# Patient Record
Sex: Male | Born: 1950
Health system: Southern US, Community
[De-identification: ages and names within clinical notes are randomized; demographics above are authoritative.]

## PROBLEM LIST (undated history)

## (undated) DIAGNOSIS — K279 Peptic ulcer, site unspecified, unspecified as acute or chronic, without hemorrhage or perforation: Secondary | ICD-10-CM

## (undated) DIAGNOSIS — C61 Malignant neoplasm of prostate: Secondary | ICD-10-CM

## (undated) DIAGNOSIS — M1990 Unspecified osteoarthritis, unspecified site: Secondary | ICD-10-CM

## (undated) DIAGNOSIS — K219 Gastro-esophageal reflux disease without esophagitis: Secondary | ICD-10-CM

## (undated) DIAGNOSIS — K5792 Diverticulitis of intestine, part unspecified, without perforation or abscess without bleeding: Secondary | ICD-10-CM

## (undated) DIAGNOSIS — G2581 Restless legs syndrome: Secondary | ICD-10-CM

## (undated) DIAGNOSIS — E785 Hyperlipidemia, unspecified: Secondary | ICD-10-CM

## (undated) HISTORY — DX: Peptic ulcer, site unspecified, unspecified as acute or chronic, without hemorrhage or perforation: K27.9

## (undated) HISTORY — PX: HERNIA REPAIR: SHX51

## (undated) HISTORY — DX: Hyperlipidemia, unspecified: E78.5

## (undated) HISTORY — DX: Malignant neoplasm of prostate: C61

---

## 1987-12-30 HISTORY — PX: NASAL POLYP EXCISION: SHX2068

## 2000-01-29 ENCOUNTER — Encounter: Payer: Self-pay | Admitting: Specialist

## 2000-01-29 ENCOUNTER — Ambulatory Visit (HOSPITAL_COMMUNITY): Admission: RE | Admit: 2000-01-29 | Discharge: 2000-01-29 | Payer: Self-pay | Admitting: Specialist

## 2000-02-12 ENCOUNTER — Encounter: Payer: Self-pay | Admitting: Specialist

## 2000-02-12 ENCOUNTER — Ambulatory Visit (HOSPITAL_COMMUNITY): Admission: RE | Admit: 2000-02-12 | Discharge: 2000-02-12 | Payer: Self-pay | Admitting: Specialist

## 2000-02-26 ENCOUNTER — Encounter: Payer: Self-pay | Admitting: Specialist

## 2000-02-26 ENCOUNTER — Ambulatory Visit (HOSPITAL_COMMUNITY): Admission: RE | Admit: 2000-02-26 | Discharge: 2000-02-26 | Payer: Self-pay | Admitting: Specialist

## 2002-01-26 ENCOUNTER — Ambulatory Visit (HOSPITAL_COMMUNITY): Admission: RE | Admit: 2002-01-26 | Discharge: 2002-01-26 | Payer: Self-pay | Admitting: Family Medicine

## 2002-01-26 ENCOUNTER — Encounter: Payer: Self-pay | Admitting: Family Medicine

## 2002-01-31 ENCOUNTER — Encounter: Payer: Self-pay | Admitting: Family Medicine

## 2002-01-31 ENCOUNTER — Ambulatory Visit (HOSPITAL_COMMUNITY): Admission: RE | Admit: 2002-01-31 | Discharge: 2002-01-31 | Payer: Self-pay | Admitting: Family Medicine

## 2003-02-24 ENCOUNTER — Encounter: Payer: Self-pay | Admitting: Family Medicine

## 2003-02-24 ENCOUNTER — Ambulatory Visit (HOSPITAL_COMMUNITY): Admission: RE | Admit: 2003-02-24 | Discharge: 2003-02-24 | Payer: Self-pay | Admitting: Family Medicine

## 2003-10-30 HISTORY — PX: KNEE ARTHROSCOPY: SHX127

## 2003-11-27 ENCOUNTER — Ambulatory Visit (HOSPITAL_BASED_OUTPATIENT_CLINIC_OR_DEPARTMENT_OTHER): Admission: RE | Admit: 2003-11-27 | Discharge: 2003-11-27 | Payer: Self-pay | Admitting: Specialist

## 2004-03-04 ENCOUNTER — Emergency Department (HOSPITAL_COMMUNITY): Admission: EM | Admit: 2004-03-04 | Discharge: 2004-03-05 | Payer: Self-pay | Admitting: *Deleted

## 2004-04-03 ENCOUNTER — Ambulatory Visit (HOSPITAL_COMMUNITY): Admission: RE | Admit: 2004-04-03 | Discharge: 2004-04-03 | Payer: Self-pay | Admitting: Family Medicine

## 2004-10-11 ENCOUNTER — Ambulatory Visit (HOSPITAL_COMMUNITY): Admission: RE | Admit: 2004-10-11 | Discharge: 2004-10-11 | Payer: Self-pay | Admitting: Gastroenterology

## 2005-05-23 ENCOUNTER — Ambulatory Visit (HOSPITAL_COMMUNITY): Admission: RE | Admit: 2005-05-23 | Discharge: 2005-05-23 | Payer: Self-pay | Admitting: Family Medicine

## 2006-04-01 ENCOUNTER — Ambulatory Visit (HOSPITAL_COMMUNITY): Admission: RE | Admit: 2006-04-01 | Discharge: 2006-04-01 | Payer: Self-pay | Admitting: Family Medicine

## 2006-08-21 ENCOUNTER — Encounter: Admission: RE | Admit: 2006-08-21 | Discharge: 2006-08-21 | Payer: Self-pay | Admitting: Gastroenterology

## 2007-08-31 ENCOUNTER — Ambulatory Visit (HOSPITAL_COMMUNITY): Admission: RE | Admit: 2007-08-31 | Discharge: 2007-08-31 | Payer: Self-pay | Admitting: Family Medicine

## 2008-07-12 ENCOUNTER — Ambulatory Visit (HOSPITAL_COMMUNITY): Admission: RE | Admit: 2008-07-12 | Discharge: 2008-07-12 | Payer: Self-pay | Admitting: Family Medicine

## 2010-07-20 ENCOUNTER — Emergency Department (HOSPITAL_COMMUNITY): Admission: EM | Admit: 2010-07-20 | Discharge: 2010-07-20 | Payer: Self-pay | Admitting: Emergency Medicine

## 2010-10-07 ENCOUNTER — Ambulatory Visit (HOSPITAL_COMMUNITY): Admission: RE | Admit: 2010-10-07 | Discharge: 2010-10-07 | Payer: Self-pay | Admitting: Family Medicine

## 2010-10-09 ENCOUNTER — Emergency Department (HOSPITAL_COMMUNITY): Admission: EM | Admit: 2010-10-09 | Discharge: 2010-10-09 | Payer: Self-pay | Admitting: Emergency Medicine

## 2010-10-23 ENCOUNTER — Encounter: Admission: RE | Admit: 2010-10-23 | Discharge: 2010-10-23 | Payer: Self-pay | Admitting: Gastroenterology

## 2011-01-18 ENCOUNTER — Encounter: Payer: Self-pay | Admitting: Family Medicine

## 2011-01-19 ENCOUNTER — Encounter: Payer: Self-pay | Admitting: Gastroenterology

## 2011-03-12 LAB — POCT CARDIAC MARKERS
CKMB, poc: <1 ng/mL — ABNORMAL LOW (ref 1.0–8.0)
Myoglobin, poc: 37.1 ng/mL (ref 12–200)
Myoglobin, poc: 42.3 ng/mL (ref 12–200)

## 2011-03-12 LAB — DIFFERENTIAL
Basophils Relative: 0 % (ref 0–1)
Eosinophils Absolute: 0.1 10*3/uL (ref 0.0–0.7)
Lymphs Abs: 1.5 10*3/uL (ref 0.7–4.0)
Monocytes Absolute: 0.4 10*3/uL (ref 0.1–1.0)
Monocytes Relative: 8 % (ref 3–12)
Neutrophils Relative %: 62 % (ref 43–77)

## 2011-03-12 LAB — BASIC METABOLIC PANEL
CO2: 30 mEq/L (ref 19–32)
Chloride: 106 mEq/L (ref 96–112)
GFR calc Af Amer: >60 mL/min (ref >60)
Glucose, Bld: 114 mg/dL — ABNORMAL HIGH (ref 70–99)
Potassium: 4.2 mEq/L (ref 3.5–5.1)
Sodium: 141 mEq/L (ref 135–145)

## 2011-03-12 LAB — CBC
HCT: 43.2 % (ref 39.0–52.0)
Hemoglobin: 15 g/dL (ref 13.0–17.0)
MCH: 32.6 pg (ref 26.0–34.0)
MCHC: 34.7 g/dL (ref 30.0–36.0)
MCV: 93.8 fL (ref 78.0–100.0)
RBC: 4.61 MIL/uL (ref 4.22–5.81)

## 2011-05-16 NOTE — Op Note (Signed)
Dominic Oliver, Dominic Oliver                  ACCOUNT NO.:  [id number]   MEDICAL RECORD NO.:  [id number]          PATIENT TYPE:  AMB   LOCATION:  ENDO                         FACILITY:  [facility]   PHYSICIAN:  Petra Kuba, M.D.    DATE OF BIRTH:  06/30/1951   DATE OF PROCEDURE:  10/11/2004  DATE OF DISCHARGE:                                 OPERATIVE REPORT   PROCEDURE:  Colonoscopy.   INDICATIONS FOR PROCEDURE:  Family history of colon polyps due for repeat  screening. Consent was signed after risks, benefits, methods, and options  were thoroughly discussed in the office actually by Dr. Madilyn Fireman and with me  prior to any sedation.   MEDICINES USED:  Demerol 60, Versed 6.   DESCRIPTION OF PROCEDURE:  Rectal inspection was pertinent for small  external hemorrhoids. Digital exam was negative. The pediatric video  adjustable colonoscope was inserted and with minimal difficulty due to a  long looping tortuous colon was able to be advanced to the cecum.  This did  require abdominal pressure and rolling him on his back.  The prep was poor  throughout. Lots of washing and suctioning was done just to see where we  were going and to keep from clogging the scope.  The cecum was identified by  the appendiceal orifice and the ileocecal valve. When the scope was  withdrawn some washing and suctioning was done but we frequently clogged the  scope and had to rewash through the suction just to unclog it although no  obvious polypoid lesions or other obvious abnormalities were seen on slow  withdrawal.  With the poor prep, obvious lesions could have been missed.  The prep seemed to be poorest around the splenic flexure which was also  fairly tortuous and also in the rectum where some formed stool could not be  washed and suctioned and lesions underneath it could have been missed. Once  back in the rectum, however, anal rectal pullthrough and retroflexion  confirmed some small hemorrhoids.  The scope was reinserted  a short ways up  the left side of the colon,  air was suctioned, scope removed. The patient  tolerated the procedure well. There was no obvious or immediate  complications.   ENDOSCOPIC DIAGNOSIS:  1.  Internal and external hemorrhoids, small.  2.  Poor prep but without any obvious lesions being seen on exam to the      cecum.   PLAN:  Recheck in one year with increased prep probably Mag Citrate and  GoLYTELY. Yearly rectal's and guaiacs per Kirk Ruths, M.D. and happy  to see back p.r.n.      MEM/MEDQ  D:  10/11/2004  T:  10/11/2004  Job:  [id number]   cc:   Kirk Ruths, M.D.  [address]  [place]  [place] [address]  Fax: [phone]

## 2011-05-16 NOTE — Op Note (Signed)
NAMEJACARI, Dominic Oliver                            ACCOUNT NO.:  [id number]   MEDICAL RECORD NO.:  [id number]                   PATIENT TYPE:  AMB   LOCATION:  DSC                                  FACILITY:  [facility]   PHYSICIAN:  Jene Every, M.D.                 DATE OF BIRTH:  12-21-1951   DATE OF PROCEDURE:  11/27/2003  DATE OF DISCHARGE:                                 OPERATIVE REPORT   PREOPERATIVE DIAGNOSIS:  Medial meniscus tear.   POSTOPERATIVE DIAGNOSIS:  1. Medial meniscus tear.  2. Chondromalacia, medial femoral condyle and patella, loose body.   OPERATION/PROCEDURE:  1. Right knee arthroscopy.  2. Partial medial meniscectomy.  3. Chondromalacia medial femoral condyle and patella.   BRIEF HISTORY AND INDICATION:  This is a 60 year old, refractory knee, pain  and giving away.  MRI indicated meniscus.  Intervention was indicated for  partial medial meniscectomy.  Risks and benefits were discussed including  bleeding, infection, __________  range of motion, need for further  resection, total knee in the future, etc.   DESCRIPTION OF PROCEDURE:  With the patient in the supine position, after  induction of adequate general anesthesia, 1 g of Kefzol, the right lower  extremity was prepped and draped in the usual sterile fashion.  Lateral  parapatellar portal and superior medial parapatellar portal were fashioned  with the #11 blade.  Ingress cannula was atraumatically placed.  Irrigant  was utilized to insufflate the joint.  Camera inserted via the lateral port  and under direct visualization medial parapatellar portal was fashioned with  a #11 blade after  localization with an 18-gauge needle sparing the medial  meniscus.  Medially seen was a displaced medial meniscus tear, radial,  flipped into the joint with significant complex tearing of the middle third  of the meniscus.  There was a loose cartilaginous body in the medial  compartment as well as grade 3 extensive changes  in the medial femoral  condyle with a chondral flap tear.  A 4.2 Kuda shaver was introduced  utilized to perform chondroplasty of the patella, evacuate the loose body  and debride the meniscus.  A basket rongeur was then introduced and utilized  to perform partial medial meniscectomy.  The portals were switched for this.  This was done to a stable base.  The shaver was then reintroduced and  utilized to contour the remaining meniscus to a stable base.  There was  tearing out to the vascular zone.  I introduced a bipolar to cauterize this  region.  There was some attenuation in the meniscal capsular junction but  then stable to probe palpation after the medial meniscal tear.  Chondroplasty of the tibial plateau was performed as well.  Extensive probe  of the medial meniscus revealed there was a stable remnant.   We then examined the suprapatellar pouch.  There was normal patellofemoral  tracking.  There was a grade 3 chondromalacia of the inferior pole and  chondroplasty was performed here as well.  The gutters were unremarkable.  The lateral meniscus revealed normal tibial plateau, lateral femoral  condyle, meniscus stable to probe palpation without evidence of tear.  There  was some degenerative fraying of the ACL and the PCL, and some partial  tearing.  He had a negative Lachman on the examination under anesthesia  prior to the procedure.  Copiously lavaged and reexamined all compartments  and the gutters and the loose cartilaginous debris.  Reexamined the medial  compartment.  Again extensive grade 3 changes, near grade 4 changes in the  medial femoral condyle.  The remaining meniscus was stable to probe  palpation.  Next, all instrumentation was removed.  Copiously lavaged.  Portals were closed with 4-0 nylon simple sutures.  Marcaine 0.25% with  epinephrine was infiltrated in the joint.  Wounds dressed sterilely.  The  patient was awakened without difficulty and transported to the  recovery room  in satisfactory condition.  The patient tolerated the procedure well.  There  were no complications.                                               Jene Every, M.D.    Cordelia Pen  D:  11/27/2003  T:  11/27/2003  Job:  [id number]

## 2012-03-19 ENCOUNTER — Other Ambulatory Visit (HOSPITAL_COMMUNITY): Payer: Self-pay | Admitting: Internal Medicine

## 2012-03-19 DIAGNOSIS — M4712 Other spondylosis with myelopathy, cervical region: Secondary | ICD-10-CM

## 2012-03-22 ENCOUNTER — Ambulatory Visit (HOSPITAL_COMMUNITY)
Admission: RE | Admit: 2012-03-22 | Discharge: 2012-03-22 | Disposition: A | Discharge status: Home / Self Care | Payer: 59 | Source: Ambulatory Visit | Attending: Internal Medicine | Admitting: Internal Medicine

## 2012-03-22 DIAGNOSIS — M542 Cervicalgia: Secondary | ICD-10-CM | POA: Insufficient documentation

## 2012-03-22 DIAGNOSIS — M5020 Other cervical disc displacement, unspecified cervical region: Secondary | ICD-10-CM | POA: Insufficient documentation

## 2012-03-22 DIAGNOSIS — M79609 Pain in unspecified limb: Secondary | ICD-10-CM | POA: Insufficient documentation

## 2012-03-22 DIAGNOSIS — M4712 Other spondylosis with myelopathy, cervical region: Secondary | ICD-10-CM

## 2012-12-23 ENCOUNTER — Other Ambulatory Visit (HOSPITAL_COMMUNITY): Payer: Self-pay | Admitting: Family Medicine

## 2012-12-23 DIAGNOSIS — N453 Epididymo-orchitis: Secondary | ICD-10-CM

## 2012-12-24 ENCOUNTER — Other Ambulatory Visit (HOSPITAL_COMMUNITY): Payer: Self-pay | Admitting: Family Medicine

## 2012-12-24 ENCOUNTER — Ambulatory Visit (HOSPITAL_COMMUNITY)
Admission: RE | Admit: 2012-12-24 | Discharge: 2012-12-24 | Disposition: A | Discharge status: Home / Self Care | Payer: 59 | Source: Ambulatory Visit | Attending: Family Medicine | Admitting: Family Medicine

## 2012-12-24 DIAGNOSIS — N453 Epididymo-orchitis: Secondary | ICD-10-CM

## 2012-12-24 DIAGNOSIS — N509 Disorder of male genital organs, unspecified: Secondary | ICD-10-CM | POA: Insufficient documentation

## 2013-02-02 ENCOUNTER — Other Ambulatory Visit: Payer: Self-pay | Admitting: Urology

## 2013-03-04 ENCOUNTER — Encounter (HOSPITAL_COMMUNITY): Payer: Self-pay | Admitting: Pharmacy Technician

## 2013-03-08 ENCOUNTER — Encounter (HOSPITAL_COMMUNITY): Payer: Self-pay

## 2013-03-08 ENCOUNTER — Encounter (HOSPITAL_COMMUNITY)
Admission: RE | Admit: 2013-03-08 | Discharge: 2013-03-08 | Disposition: A | Discharge status: Home / Self Care | Payer: No Typology Code available for payment source | Source: Ambulatory Visit | Attending: Urology | Admitting: Urology

## 2013-03-08 ENCOUNTER — Ambulatory Visit (HOSPITAL_COMMUNITY)
Admission: RE | Admit: 2013-03-08 | Discharge: 2013-03-08 | Disposition: A | Discharge status: Home / Self Care | Payer: No Typology Code available for payment source | Source: Ambulatory Visit | Attending: Urology | Admitting: Urology

## 2013-03-08 DIAGNOSIS — Z01812 Encounter for preprocedural laboratory examination: Secondary | ICD-10-CM | POA: Insufficient documentation

## 2013-03-08 DIAGNOSIS — R911 Solitary pulmonary nodule: Secondary | ICD-10-CM | POA: Insufficient documentation

## 2013-03-08 DIAGNOSIS — C61 Malignant neoplasm of prostate: Secondary | ICD-10-CM | POA: Insufficient documentation

## 2013-03-08 DIAGNOSIS — Z01810 Encounter for preprocedural cardiovascular examination: Secondary | ICD-10-CM | POA: Insufficient documentation

## 2013-03-08 HISTORY — DX: Restless legs syndrome: G25.81

## 2013-03-08 HISTORY — DX: Gastro-esophageal reflux disease without esophagitis: K21.9

## 2013-03-08 HISTORY — DX: Unspecified osteoarthritis, unspecified site: M19.90

## 2013-03-08 LAB — BASIC METABOLIC PANEL WITH GFR
BUN: 19 mg/dL (ref 6–23)
CO2: 27 meq/L (ref 19–32)
Calcium: 8.9 mg/dL (ref 8.4–10.5)
Chloride: 104 meq/L (ref 96–112)
Creatinine, Ser: 0.97 mg/dL (ref 0.50–1.35)
GFR calc Af Amer: >90 mL/min
GFR calc non Af Amer: 89 mL/min — ABNORMAL LOW
Glucose, Bld: 100 mg/dL — ABNORMAL HIGH (ref 70–99)
Potassium: 4.1 meq/L (ref 3.5–5.1)
Sodium: 139 meq/L (ref 135–145)

## 2013-03-08 LAB — CBC
HCT: 40.3 % (ref 39.0–52.0)
Hemoglobin: 14 g/dL (ref 13.0–17.0)
MCH: 31.6 pg (ref 26.0–34.0)
MCHC: 34.7 g/dL (ref 30.0–36.0)
MCV: 91 fL (ref 78.0–100.0)
Platelets: 243 10*3/uL (ref 150–400)
RBC: 4.43 MIL/uL (ref 4.22–5.81)
RDW: 12.2 % (ref 11.5–15.5)
WBC: 5.1 10*3/uL (ref 4.0–10.5)

## 2013-03-08 LAB — SURGICAL PCR SCREEN
MRSA, PCR: NEGATIVE
Staphylococcus aureus: NEGATIVE

## 2013-03-08 NOTE — Pre-Procedure Instructions (Addendum)
PREOP EKG AND CXR WERE DONE TODAY--PREVIOUS CXR'S SHOW COPD AND PT WAS A SMOKER IN THE PAST. PREOP CXR REPORT ABNORMAL-CALLED BY RADIOLOGY TO DR. MANNY OR HIS REPRESENTATIVE - PER THE REPORT.  I ALSO FAXED A COPY OF THE REPORT TO DR. MANNY'S OFFICE.

## 2013-03-08 NOTE — Patient Instructions (Signed)
YOUR SURGERY IS SCHEDULED AT [facility]  ON:  Friday  3/21  REPORT TO [facility] AT:  5:15 AM      PHONE # FOR SHORT STAY IS [phone]  FOLLOW BOWEL PREP INSTRUCTIONS DAY BEFORE SURGERY - INSTRUCTIONS WERE GIVEN BY DR. MANNY'S OFFICE.  DO NOT EAT OR DRINK ANYTHING AFTER MIDNIGHT THE NIGHT BEFORE YOUR SURGERY.  YOU MAY BRUSH YOUR TEETH, RINSE OUT YOUR MOUTH--BUT NO WATER, NO FOOD, NO CHEWING GUM, NO MINTS, NO CANDIES, NO CHEWING TOBACCO.  PLEASE TAKE THE FOLLOWING MEDICATIONS THE AM OF YOUR SURGERY WITH A FEW SIPS OF WATER:  OMEPRAZOLE  IF YOU USE INHALERS--USE YOUR INHALERS THE AM OF YOUR SURGERY AND BRING INHALERS TO THE HOSPITAL.    IF YOU ARE DIABETIC:  DO NOT TAKE ANY DIABETIC MEDICATIONS THE AM OF YOUR SURGERY.  IF YOU TAKE INSULIN IN THE EVENINGS--PLEASE ONLY TAKE 1/2 NORMAL EVENING DOSE THE NIGHT BEFORE YOUR SURGERY.  NO INSULIN THE AM OF YOUR SURGERY.  IF YOU HAVE SLEEP APNEA AND USE CPAP OR BIPAP--PLEASE BRING THE MASK AND THE TUBING.  DO NOT BRING YOUR MACHINE.  DO NOT BRING VALUABLES, MONEY, CREDIT CARDS.  DO NOT WEAR JEWELRY, MAKE-UP, NAIL POLISH AND NO METAL PINS OR CLIPS IN YOUR HAIR. CONTACT LENS, DENTURES / PARTIALS, GLASSES SHOULD NOT BE WORN TO SURGERY AND IN MOST CASES-HEARING AIDS WILL NEED TO BE REMOVED.  BRING YOUR GLASSES CASE, ANY EQUIPMENT NEEDED FOR YOUR CONTACT LENS. FOR PATIENTS ADMITTED TO THE HOSPITAL--CHECK OUT TIME THE DAY OF DISCHARGE IS 11:00 AM.  ALL INPATIENT ROOMS ARE PRIVATE - WITH BATHROOM, TELEPHONE, TELEVISION AND WIFI INTERNET.  IF YOU ARE BEING DISCHARGED THE SAME DAY OF YOUR SURGERY--YOU CAN NOT DRIVE YOURSELF HOME--AND SHOULD NOT GO HOME ALONE BY TAXI OR BUS.  NO DRIVING OR OPERATING MACHINERY FOR 24 HOURS FOLLOWING ANESTHESIA / PAIN MEDICATIONS.  PLEASE MAKE ARRANGEMENTS FOR SOMEONE TO BE WITH YOU AT HOME THE FIRST 24 HOURS AFTER SURGERY. RESPONSIBLE DRIVER'S NAME___________________________                      PHONE #   _______________________                               PLEASE READ OVER ANY  FACT SHEETS THAT YOU WERE GIVEN: MRSA INFORMATION, BLOOD TRANSFUSION INFORMATION, INCENTIVE SPIROMETER INFORMATION. FAILURE TO FOLLOW THESE INSTRUCTIONS MAY RESULT IN THE CANCELLATION OF YOUR SURGERY.   PATIENT SIGNATURE_________________________________

## 2013-03-17 ENCOUNTER — Encounter (HOSPITAL_COMMUNITY): Payer: Self-pay | Admitting: General Practice

## 2013-03-17 NOTE — Anesthesia Preprocedure Evaluation (Addendum)
Anesthesia Evaluation  Patient identified by MRN, date of birth, ID band Patient awake    Reviewed: Allergy & Precautions, H&P , NPO status , Patient's Chart, lab work & pertinent test results  Airway Mallampati: II TM Distance: >3 FB Neck ROM: Full    Dental no notable dental hx.    Pulmonary former smoker,  breath sounds clear to auscultation  Pulmonary exam normal       Cardiovascular Exercise Tolerance: Good negative cardio ROS  Rhythm:Regular Rate:Normal  ECG normal. CXR results reviewed.   Neuro/Psych Restless leg syndrome. negative neurological ROS  negative psych ROS   GI/Hepatic Neg liver ROS, GERD-  Medicated,  Endo/Other  negative endocrine ROS  Renal/GU negative Renal ROS  negative genitourinary   Musculoskeletal negative musculoskeletal ROS (+)   Abdominal   Peds negative pediatric ROS (+)  Hematology negative hematology ROS (+)   Anesthesia Other Findings   Reproductive/Obstetrics negative OB ROS                         Anesthesia Physical Anesthesia Plan  ASA: II  Anesthesia Plan: General   Post-op Pain Management:    Induction: Intravenous  Airway Management Planned: Oral ETT  Additional Equipment:   Intra-op Plan:   Post-operative Plan: Extubation in OR  Informed Consent: I have reviewed the patients History and Physical, chart, labs and discussed the procedure including the risks, benefits and alternatives for the proposed anesthesia with the patient or authorized representative who has indicated his/her understanding and acceptance.   Dental advisory given  Plan Discussed with: CRNA  Anesthesia Plan Comments:         Anesthesia Quick Evaluation

## 2013-03-18 ENCOUNTER — Encounter (HOSPITAL_COMMUNITY)
Admission: RE | Disposition: A | Discharge status: Home / Self Care | Payer: Self-pay | Source: Ambulatory Visit | Attending: Urology

## 2013-03-18 ENCOUNTER — Inpatient Hospital Stay (HOSPITAL_COMMUNITY): Payer: No Typology Code available for payment source | Admitting: Anesthesiology

## 2013-03-18 ENCOUNTER — Encounter (HOSPITAL_COMMUNITY): Payer: Self-pay | Admitting: *Deleted

## 2013-03-18 ENCOUNTER — Encounter (HOSPITAL_COMMUNITY): Payer: Self-pay | Admitting: Anesthesiology

## 2013-03-18 ENCOUNTER — Inpatient Hospital Stay (HOSPITAL_COMMUNITY)
Admission: RE | Admit: 2013-03-18 | Discharge: 2013-03-20 | DRG: 708 | Disposition: A | Discharge status: Home / Self Care | Payer: No Typology Code available for payment source | Source: Ambulatory Visit | Attending: Urology | Admitting: Urology

## 2013-03-18 DIAGNOSIS — Z8042 Family history of malignant neoplasm of prostate: Secondary | ICD-10-CM

## 2013-03-18 DIAGNOSIS — M129 Arthropathy, unspecified: Secondary | ICD-10-CM | POA: Diagnosis present

## 2013-03-18 DIAGNOSIS — G2581 Restless legs syndrome: Secondary | ICD-10-CM | POA: Diagnosis present

## 2013-03-18 DIAGNOSIS — K219 Gastro-esophageal reflux disease without esophagitis: Secondary | ICD-10-CM | POA: Diagnosis present

## 2013-03-18 DIAGNOSIS — Z79899 Other long term (current) drug therapy: Secondary | ICD-10-CM

## 2013-03-18 DIAGNOSIS — C61 Malignant neoplasm of prostate: Principal | ICD-10-CM | POA: Diagnosis present

## 2013-03-18 DIAGNOSIS — Z87891 Personal history of nicotine dependence: Secondary | ICD-10-CM

## 2013-03-18 HISTORY — PX: LYMPHADENECTOMY: SHX5960

## 2013-03-18 HISTORY — PX: ROBOT ASSISTED LAPAROSCOPIC RADICAL PROSTATECTOMY: SHX5141

## 2013-03-18 LAB — TYPE AND SCREEN
ABO/RH(D): O POS
Antibody Screen: NEGATIVE

## 2013-03-18 LAB — HEMOGLOBIN AND HEMATOCRIT, BLOOD: HCT: 38.4 % — ABNORMAL LOW (ref 39.0–52.0)

## 2013-03-18 SURGERY — ROBOTIC ASSISTED LAPAROSCOPIC RADICAL PROSTATECTOMY
Anesthesia: General | Wound class: Clean Contaminated

## 2013-03-18 MED ORDER — DEXAMETHASONE SODIUM PHOSPHATE 10 MG/ML IJ SOLN
INTRAMUSCULAR | Status: DC | PRN
  Administered 2013-03-18: 10 mg via INTRAVENOUS

## 2013-03-18 MED ORDER — PANTOPRAZOLE SODIUM 40 MG PO TBEC
40.0000 mg | DELAYED_RELEASE_TABLET | Freq: Every day | ORAL | Status: DC
  Administered 2013-03-19: 40 mg via ORAL
  Filled 2013-03-18 (×2): qty 1

## 2013-03-18 MED ORDER — SENNA 8.6 MG PO TABS
1.0000 | ORAL_TABLET | Freq: Two times a day (BID) | ORAL | Status: DC
  Administered 2013-03-18 – 2013-03-19 (×4): 8.6 mg via ORAL
  Filled 2013-03-18 (×4): qty 1

## 2013-03-18 MED ORDER — DOCUSATE SODIUM 100 MG PO CAPS
100.0000 mg | ORAL_CAPSULE | Freq: Two times a day (BID) | ORAL | Status: DC
  Administered 2013-03-18 – 2013-03-19 (×4): 100 mg via ORAL
  Filled 2013-03-18 (×6): qty 1

## 2013-03-18 MED ORDER — MIDAZOLAM HCL 5 MG/5ML IJ SOLN
INTRAMUSCULAR | Status: DC | PRN
  Administered 2013-03-18: 2 mg via INTRAVENOUS

## 2013-03-18 MED ORDER — LIDOCAINE HCL (CARDIAC) 20 MG/ML IV SOLN
INTRAVENOUS | Status: DC | PRN
  Administered 2013-03-18: 100 mg via INTRAVENOUS

## 2013-03-18 MED ORDER — EPHEDRINE SULFATE 50 MG/ML IJ SOLN
INTRAMUSCULAR | Status: DC | PRN
  Administered 2013-03-18: 5 mg via INTRAVENOUS

## 2013-03-18 MED ORDER — ACETAMINOPHEN 10 MG/ML IV SOLN
INTRAVENOUS | Status: DC | PRN
  Administered 2013-03-18: 1000 mg via INTRAVENOUS

## 2013-03-18 MED ORDER — HYDROCODONE-ACETAMINOPHEN 5-325 MG PO TABS: 1.0000 | ORAL_TABLET | Freq: Four times a day (QID) | ORAL | Status: DC | PRN

## 2013-03-18 MED ORDER — KCL IN DEXTROSE-NACL 20-5-0.45 MEQ/L-%-% IV SOLN
INTRAVENOUS | Status: DC
  Administered 2013-03-18: 100 mL/h via INTRAVENOUS
  Administered 2013-03-18: 23:00:00 via INTRAVENOUS
  Filled 2013-03-18 (×7): qty 1000

## 2013-03-18 MED ORDER — PROMETHAZINE HCL 25 MG/ML IJ SOLN: 6.2500 mg | INTRAMUSCULAR | Status: DC | PRN

## 2013-03-18 MED ORDER — SODIUM CHLORIDE 0.9 % IJ SOLN
INTRAMUSCULAR | Status: DC | PRN
  Administered 2013-03-18: 10:00:00

## 2013-03-18 MED ORDER — LACTATED RINGERS IV SOLN
INTRAVENOUS | Status: DC | PRN
  Administered 2013-03-18 (×3): via INTRAVENOUS

## 2013-03-18 MED ORDER — KETOROLAC TROMETHAMINE 15 MG/ML IJ SOLN
15.0000 mg | Freq: Four times a day (QID) | INTRAMUSCULAR | Status: AC
  Administered 2013-03-18 – 2013-03-19 (×6): 15 mg via INTRAVENOUS
  Filled 2013-03-18 (×6): qty 1

## 2013-03-18 MED ORDER — BUPIVACAINE LIPOSOME 1.3 % IJ SUSP
20.0000 mL | Freq: Once | INTRAMUSCULAR | Status: DC
  Filled 2013-03-18 (×2): qty 20

## 2013-03-18 MED ORDER — HYDROMORPHONE HCL PF 1 MG/ML IJ SOLN
INTRAMUSCULAR | Status: DC | PRN
  Administered 2013-03-18: 1 mg via INTRAVENOUS

## 2013-03-18 MED ORDER — LACTATED RINGERS IR SOLN
Status: DC | PRN
  Administered 2013-03-18: 1000 mL

## 2013-03-18 MED ORDER — CIPROFLOXACIN HCL 500 MG PO TABS: 500.0000 mg | ORAL_TABLET | Freq: Two times a day (BID) | ORAL | Status: DC

## 2013-03-18 MED ORDER — HYDROMORPHONE HCL PF 1 MG/ML IJ SOLN
0.5000 mg | INTRAMUSCULAR | Status: DC | PRN
  Administered 2013-03-18 (×2): 1 mg via INTRAVENOUS
  Administered 2013-03-19: 0.5 mg via INTRAVENOUS
  Filled 2013-03-18 (×3): qty 1

## 2013-03-18 MED ORDER — HYDROMORPHONE HCL PF 1 MG/ML IJ SOLN: 0.2500 mg | INTRAMUSCULAR | Status: DC | PRN

## 2013-03-18 MED ORDER — SUCCINYLCHOLINE CHLORIDE 20 MG/ML IJ SOLN
INTRAMUSCULAR | Status: DC | PRN
  Administered 2013-03-18: 100 mg via INTRAVENOUS

## 2013-03-18 MED ORDER — SODIUM CHLORIDE 0.9 % IV BOLUS (SEPSIS)
1000.0000 mL | Freq: Once | INTRAVENOUS | Status: AC
  Administered 2013-03-18: 1000 mL via INTRAVENOUS

## 2013-03-18 MED ORDER — FENTANYL CITRATE 0.05 MG/ML IJ SOLN
INTRAMUSCULAR | Status: DC | PRN
  Administered 2013-03-18 (×5): 50 ug via INTRAVENOUS

## 2013-03-18 MED ORDER — LABETALOL HCL 5 MG/ML IV SOLN
5.0000 mg | INTRAVENOUS | Status: DC
  Administered 2013-03-18: 5 mg via INTRAVENOUS

## 2013-03-18 MED ORDER — CEFAZOLIN SODIUM-DEXTROSE 2-3 GM-% IV SOLR
2.0000 g | INTRAVENOUS | Status: AC
  Administered 2013-03-18: 2 g via INTRAVENOUS

## 2013-03-18 MED ORDER — GLYCOPYRROLATE 0.2 MG/ML IJ SOLN
INTRAMUSCULAR | Status: DC | PRN
  Administered 2013-03-18: 0.6 mg via INTRAVENOUS

## 2013-03-18 MED ORDER — NEOSTIGMINE METHYLSULFATE 1 MG/ML IJ SOLN
INTRAMUSCULAR | Status: DC | PRN
  Administered 2013-03-18: 4 mg via INTRAVENOUS

## 2013-03-18 MED ORDER — ONDANSETRON HCL 4 MG/2ML IJ SOLN
4.0000 mg | INTRAMUSCULAR | Status: DC | PRN
  Administered 2013-03-18: 4 mg via INTRAVENOUS
  Filled 2013-03-18: qty 2

## 2013-03-18 MED ORDER — AMITRIPTYLINE HCL 50 MG PO TABS
50.0000 mg | ORAL_TABLET | Freq: Every day | ORAL | Status: DC
Start: 2013-03-18 — End: 2013-03-20
  Administered 2013-03-18 – 2013-03-19 (×2): 50 mg via ORAL
  Filled 2013-03-18 (×3): qty 1

## 2013-03-18 MED ORDER — ROCURONIUM BROMIDE 100 MG/10ML IV SOLN
INTRAVENOUS | Status: DC | PRN
  Administered 2013-03-18: 20 mg via INTRAVENOUS
  Administered 2013-03-18: 50 mg via INTRAVENOUS
  Administered 2013-03-18 (×2): 10 mg via INTRAVENOUS

## 2013-03-18 MED ORDER — PROPOFOL 10 MG/ML IV BOLUS
INTRAVENOUS | Status: DC | PRN
  Administered 2013-03-18: 170 mg via INTRAVENOUS

## 2013-03-18 MED ORDER — OXYCODONE HCL 5 MG PO TABS
5.0000 mg | ORAL_TABLET | ORAL | Status: DC | PRN
  Administered 2013-03-18 – 2013-03-20 (×8): 5 mg via ORAL
  Filled 2013-03-18 (×8): qty 1

## 2013-03-18 MED ORDER — ACETAMINOPHEN 10 MG/ML IV SOLN
1000.0000 mg | Freq: Four times a day (QID) | INTRAVENOUS | Status: AC
  Administered 2013-03-18 – 2013-03-19 (×4): 1000 mg via INTRAVENOUS
  Filled 2013-03-18 (×4): qty 100

## 2013-03-18 MED ORDER — ONDANSETRON HCL 4 MG/2ML IJ SOLN
INTRAMUSCULAR | Status: DC | PRN
  Administered 2013-03-18: 4 mg via INTRAVENOUS

## 2013-03-18 SURGICAL SUPPLY — 47 items
CANISTER SUCTION 2500CC (MISCELLANEOUS) ×3 IMPLANT
CATH FOLEY 2WAY SLVR 18FR 30CC (CATHETERS) ×3 IMPLANT
CATH ROBINSON RED A/P 16FR (CATHETERS) ×3 IMPLANT
CATH TIEMANN FOLEY 18FR 5CC (CATHETERS) ×3 IMPLANT
CHLORAPREP W/TINT 26ML (MISCELLANEOUS) ×3 IMPLANT
CLIP LIGATING HEM O LOK PURPLE (MISCELLANEOUS) ×3 IMPLANT
CLIP LIGATING HEMO LOK XL GOLD (MISCELLANEOUS) IMPLANT
CLOTH BEACON ORANGE TIMEOUT ST (SAFETY) ×3 IMPLANT
CORD HIGH FREQUENCY UNIPOLAR (ELECTROSURGICAL) ×3 IMPLANT
COVER SURGICAL LIGHT HANDLE (MISCELLANEOUS) ×3 IMPLANT
COVER TIP SHEARS 8 DVNC (MISCELLANEOUS) ×2 IMPLANT
COVER TIP SHEARS 8MM DA VINCI (MISCELLANEOUS) ×1
CUTTER ECHEON FLEX ENDO 45 340 (ENDOMECHANICALS) ×3 IMPLANT
DECANTER SPIKE VIAL GLASS SM (MISCELLANEOUS) IMPLANT
DRAPE SURG IRRIG POUCH 19X23 (DRAPES) ×3 IMPLANT
DRSG TEGADERM 2-3/8X2-3/4 SM (GAUZE/BANDAGES/DRESSINGS) ×12 IMPLANT
DRSG TEGADERM 4X4.75 (GAUZE/BANDAGES/DRESSINGS) ×6 IMPLANT
DRSG TEGADERM 6X8 (GAUZE/BANDAGES/DRESSINGS) ×6 IMPLANT
ELECT REM PT RETURN 9FT ADLT (ELECTROSURGICAL) ×3
ELECTRODE REM PT RTRN 9FT ADLT (ELECTROSURGICAL) ×2 IMPLANT
GAUZE SPONGE 2X2 8PLY STRL LF (GAUZE/BANDAGES/DRESSINGS) IMPLANT
GLOVE BIO SURGEON STRL SZ 6.5 (GLOVE) ×3 IMPLANT
GLOVE BIOGEL M STRL SZ7.5 (GLOVE) ×36 IMPLANT
GOWN STRL NON-REIN LRG LVL3 (GOWN DISPOSABLE) ×12 IMPLANT
GOWN STRL REIN XL XLG (GOWN DISPOSABLE) ×6 IMPLANT
HOLDER FOLEY CATH W/STRAP (MISCELLANEOUS) ×3 IMPLANT
IV LACTATED RINGERS 1000ML (IV SOLUTION) IMPLANT
KIT ACCESSORY DA VINCI DISP (KITS) ×1
KIT ACCESSORY DVNC DISP (KITS) ×2 IMPLANT
NEEDLE INSUFFLATION 14GA 120MM (NEEDLE) ×3 IMPLANT
PACK ROBOT UROLOGY CUSTOM (CUSTOM PROCEDURE TRAY) ×3 IMPLANT
RELOAD GREEN ECHELON 45 (STAPLE) ×3 IMPLANT
SEALER TISSUE G2 STRG ARTC 35C (ENDOMECHANICALS) IMPLANT
SET TUBE IRRIG SUCTION NO TIP (IRRIGATION / IRRIGATOR) ×3 IMPLANT
SOLUTION ELECTROLUBE (MISCELLANEOUS) ×3 IMPLANT
SPONGE GAUZE 2X2 STER 10/PKG (GAUZE/BANDAGES/DRESSINGS)
SPONGE LAP 4X18 X RAY DECT (DISPOSABLE) ×3 IMPLANT
SUT ETHILON 3 0 PS 1 (SUTURE) ×3 IMPLANT
SUT MNCRL AB 4-0 PS2 18 (SUTURE) ×3 IMPLANT
SUT PDS AB 1 CT1 27 (SUTURE) ×6 IMPLANT
SUT VICRYL 0 UR6 27IN ABS (SUTURE) ×3 IMPLANT
SUT VLOC BARB 180 ABS3/0GR12 (SUTURE) ×9
SUTURE VLOC BRB 180 ABS3/0GR12 (SUTURE) ×6 IMPLANT
SYR 20CC LL (SYRINGE) ×6 IMPLANT
TOWEL OR NON WOVEN STRL DISP B (DISPOSABLE) ×3 IMPLANT
TROCAR 12M 150ML BLUNT (TROCAR) ×3 IMPLANT
WATER STERILE IRR 1500ML POUR (IV SOLUTION) ×6 IMPLANT

## 2013-03-18 NOTE — Brief Op Note (Signed)
03/18/2013  10:22 AM  PATIENT:  Dominic Oliver  62 y.o. male  PRE-OPERATIVE DIAGNOSIS:  PROSTATE CANCER  POST-OPERATIVE DIAGNOSIS:  prostate cancer  PROCEDURE:  Procedure(s): ROBOTIC ASSISTED LAPAROSCOPIC RADICAL PROSTATECTOMY (N/A) LYMPHADENECTOMY (Bilateral)  SURGEON:  Surgeon(s) and Role:    * Sebastian Ache, MD - Primary  PHYSICIAN ASSISTANT:   ASSISTANTS: Lujean Rave, PA   ANESTHESIA:   general  EBL:  Total I/O In: 2000 [I.V.:2000] Out: 200 [Blood:200]  BLOOD ADMINISTERED:none  DRAINS: JP to bulb suction, LLQ   LOCAL MEDICATIONS USED:  MARCAINE     SPECIMEN:  Source of Specimen:  1 - Prostate, 2 - Peri-prostatic fat, 3 - Rt and Lt pelvic lymph nodes  DISPOSITION OF SPECIMEN:  PATHOLOGY  COUNTS:  YES  TOURNIQUET:  * No tourniquets in log *  DICTATION: .Other Dictation: Dictation Number [id number]  PLAN OF CARE: Admit to inpatient   PATIENT DISPOSITION:  PACU - hemodynamically stable.   Delay start of Pharmacological VTE agent (>24hrs) due to surgical blood loss or risk of bleeding: yes

## 2013-03-18 NOTE — Anesthesia Postprocedure Evaluation (Signed)
  Anesthesia Post-op Note  Patient: Dominic Oliver  Procedure(s) Performed: Procedure(s) (LRB): ROBOTIC ASSISTED LAPAROSCOPIC RADICAL PROSTATECTOMY (N/A) LYMPHADENECTOMY (Bilateral)  Patient Location: PACU  Anesthesia Type: General  Level of Consciousness: awake and alert   Airway and Oxygen Therapy: Patient Spontanous Breathing  Post-op Pain: mild  Post-op Assessment: Post-op Vital signs reviewed, Patient's Cardiovascular Status Stable, Respiratory Function Stable, Patent Airway and No signs of Nausea or vomiting  Last Vitals:  Filed Vitals:   03/18/13 1135  BP: 123/84  Pulse: 83  Temp:   Resp: 14    Post-op Vital Signs: stable   Complications: No apparent anesthesia complications

## 2013-03-18 NOTE — Transfer of Care (Signed)
Immediate Anesthesia Transfer of Care Note  Patient: Dominic Oliver  Procedure(s) Performed: Procedure(s): ROBOTIC ASSISTED LAPAROSCOPIC RADICAL PROSTATECTOMY (N/A) LYMPHADENECTOMY (Bilateral)  Patient Location: PACU  Anesthesia Type:General  Level of Consciousness: sedated  Airway & Oxygen Therapy: Patient Spontanous Breathing and Patient connected to face mask oxygen  Post-op Assessment: Report given to PACU RN and Post -op Vital signs reviewed and stable  Post vital signs: Reviewed and stable  Complications: No apparent anesthesia complications

## 2013-03-18 NOTE — Progress Notes (Signed)
Patient ID: Dominic Oliver, male   DOB: 06/02/1951, 62 y.o.   MRN: [id number] Post-op note  Subjective: The patient is doing well.  No complaints except right side discomfort.  Denies N/V.  Objective: Vital signs in last 24 hours: Temp:  [97.4 F (36.3 C)-98.2 F (36.8 C)] 97.4 F (36.3 C) (03/21 1154) Pulse Rate:  [78-96] 86 (03/21 1154) Resp:  [11-20] 12 (03/21 1154) BP: (114-153)/(75-90) 127/82 mmHg (03/21 1154) SpO2:  [98 %-100 %] 98 % (03/21 1154)  Intake/Output from previous day:   Intake/Output this shift: Total I/O In: 3500 [I.V.:2500; IV Piggyback:1000] Out: 815 [Urine:550; Drains:65; Blood:200]  Physical Exam:  General: Alert and oriented. Abdomen: Soft, Nondistended. Incisions: Clean and dry.  Lab Results:  Recent Labs  03/18/13 1046  HGB 13.2  HCT 38.4*    Assessment/Plan: POD#0   1) Continue to monitor 2) Pain control, clears, amb, I.S., DVT prophy    LOS: 0 days   Silas Flood. 03/18/2013, 3:26 PM

## 2013-03-18 NOTE — H&P (Signed)
Dominic Oliver is an 62 y.o. male.    Chief Complaint: Pre-Op Prostatectomy  HPI:  1 - Prostate Cancer - Pt's father with prostate cancer and prostatectomy. Recent PSA History: 12/2012- Prostate Biopsy --> 3 cores all left sided Gleason 3+3=6 adenocarcinoma 30-50% of cores. TRUS 37ml with median lobe and sig left sided hypoechic lesions.  11/2012: 4.59 / DRE 25gm, no nodules --  PMH sig for Restless leg, OA. No CV disease. No strong blood thinners.   Today Dominic Oliver is seen to progress with robotic prostatectomy. He had abnormal pre-op chest X-Ray, which follow-up chest CT found no abnormalities. No interval fevers.  Past Medical History  Diagnosis Date  . Restless leg syndrome     amitripytline helps  . GERD (gastroesophageal reflux disease)   . Cancer     prostate  . Arthritis     knees, spine, thumbs    Past Surgical History  Procedure Laterality Date  . Sinus polyp removed  1989  . Right knee arthroscpy  2005    History reviewed. No pertinent family history. Social History:  reports that he has quit smoking. He has never used smokeless tobacco. He reports that  drinks alcohol. He reports that he does not use illicit drugs.  Allergies: No Known Allergies  Medications Prior to Admission  Medication Sig Dispense Refill  . amitriptyline (ELAVIL) 50 MG tablet Take 50 mg by mouth at bedtime.      . celecoxib (CELEBREX) 200 MG capsule Take 200 mg by mouth 2 (two) times daily.      Marland Kitchen omeprazole (PRILOSEC) 40 MG capsule Take 40 mg by mouth daily.        No results found for this or any previous visit (from the past 48 hour(s)). No results found.  Review of Systems  Constitutional: Negative.  Negative for fever and chills.  HENT: Negative.   Eyes: Negative.   Respiratory: Negative.   Cardiovascular: Negative.   Gastrointestinal: Negative.   Genitourinary: Negative.   Musculoskeletal: Negative.   Skin: Negative.   Neurological: Negative.   Endo/Heme/Allergies: Negative.    Psychiatric/Behavioral: Negative.     Blood pressure 123/79, pulse 81, temperature 97.8 F (36.6 C), temperature source Oral, resp. rate 18, SpO2 100.00%. Physical Exam  Constitutional: He is oriented to person, place, and time. He appears well-developed and well-nourished.  HENT:  Head: Normocephalic and atraumatic.  Eyes: EOM are normal. Pupils are equal, round, and reactive to light.  Neck: Normal range of motion. Neck supple.  Cardiovascular: Normal rate and regular rhythm.   Respiratory: Effort normal and breath sounds normal.  GI: Soft. Bowel sounds are normal.  No scars, no hernias  Genitourinary: Penis normal.  Musculoskeletal: Normal range of motion.  Neurological: He is alert and oriented to person, place, and time.  Skin: Skin is warm and dry.  Psychiatric: He has a normal mood and affect. His behavior is normal. Judgment and thought content normal.     Assessment/Plan  1 - Prostate Cancer  - We re-discussed prostatectomy and specifically robotic prostatectomy with bilateral pelvic lymphadenectomy being the technique that I most commonly perform. I showed the patient on their abdomen the approximately 6 small incision (trocar) sites as well as presumed extraction sites with robotic approach as well as possible open incision sites should open conversion be necessary. We discussed peri-operative risks including bleeding, infection, deep vein thrombosis, pulmonary embolism, compartment syndrome, nuropathy / neuropraxia, heart attack, stroke, death, as well as long-term risks such as non-cure /  need for additional therapy. We specifically addressed that the procedure would compromise urinary control leading to stress incontinence which typically resolves with time and pelvic rehabilitation (Kegel's, etc..), but can sometimes be permanent and require additional therapy including surgery. We also specifically addressed sexual sequellae including significant erectile dysfunction which  typically partially resolves with time but can also be permanent and require additional therapy including surgery.   Were- discussed the typical hospital course including usual 1-2 night hospitalization, discharge with foley catheter in place usually for 1-2 weeks before voiding trial as well as usually 2 week recovery until able to perform most non-strenuous activity and 6 weeks until able to return to most jobs and more strenuous activity such as exercise. Dominic Oliver wishes to proceed today as scheduled.  Dominic Oliver 03/18/2013, 6:10 AM

## 2013-03-19 LAB — HEMOGLOBIN AND HEMATOCRIT, BLOOD
HCT: 33.7 % — ABNORMAL LOW (ref 39.0–52.0)
Hemoglobin: 11.9 g/dL — ABNORMAL LOW (ref 13.0–17.0)

## 2013-03-19 MED ORDER — ALUM & MAG HYDROXIDE-SIMETH 200-200-20 MG/5ML PO SUSP
30.0000 mL | Freq: Four times a day (QID) | ORAL | Status: DC | PRN
  Administered 2013-03-20: 30 mL via ORAL
  Filled 2013-03-19: qty 30

## 2013-03-19 NOTE — Op Note (Signed)
NAMEJAVAN, GONZAGA NO.:  [id number]  MEDICAL RECORD NO.:  [id number]  LOCATION:  1416                         FACILITY:  [facility]  PHYSICIAN:  Sebastian Ache, MD     DATE OF BIRTH:  [birth year]  DATE OF PROCEDURE:  03/18/2013 DATE OF DISCHARGE:                              OPERATIVE REPORT   DIAGNOSIS:  Low risk prostate cancer.  PROCEDURE:  Robotic-assisted laparoscopic radical prostatectomy with bilateral limited pelvic lymphadenectomy.  ASSISTANT SURGEON:  Pecola Leisure, PA.  SPECIMENS: 1. Right pelvic lymph nodes. 2. Left pelvic lymph nodes. 3. Periprostatic fat. 4. Prostate.  FINDINGS:  No significant adhesions, no obvious enlarged adenopathy.  ESTIMATED BLOOD LOSS:  100 mL.  DRAINS: 1. Jackson-Pratt drain to bulb suction. 2. Foley catheter straight drain.  INDICATION:  Mr. Stanco is a very pleasant 62 year old gentleman, who was found to have a low risk prostate cancer with 3 cores of Gleason 6 all on the left side.  Clinically staged T1c.  Options were discussed for management including active surveillance versus definitive therapy with ablative therapies versus surgical extirpation and he wished to proceed with the latter with minimally invasive assistance.  Informed consent was obtained and placed in medical record for robotic prostatectomy.  PROCEDURE IN DETAIL:  The patient being Makoto Sellitto, procedure being robotic prostatectomy was confirmed.  Procedure was carried out.  Time- out was performed.  Intravenous antibiotics administered.  General endotracheal anesthesia was introduced.  The patient was placed into the low lithotomy position.  Sterile field was created by prepping and draping the patient's penis, perineum, and proximal thighs using iodine x3 and his infraxiphoid abdomen using chlorhexidine gluconate.  Foley catheter was placed per urethra straight drain.  A high-flow low pressure pneumoperitoneum was then easily obtained  using Veress technique in the infraumbilical midline having passed the aspiration and drop test.  A 12-mm robotic camera port was then placed in the same location.  Examination of peritoneal cavity revealed no significant adhesions and no evidence of visceral injury.  Additional ports were then placed as follows; right paramedian 8-mm robotic port, right far lateral 12-mm assistant port 3 fingerbreadths superior to the anterior superior iliac spine, right paramedian 5 mm assist port, left paramedian 8-mm robotic port, left far lateral 8-mm robotic port 2 fingerbreadths superior to the anterior iliac spine.  Robot was docked and passed through the electronic checks.  Initial attention was directed at right lateral dissection.  Incision was made lateral to the right medial umbilical ligament from the midline towards the area of the vas deferens and then following the curvature of the iliac vessels.  The vas was ligated.  The lateral wall of the bladder was carefully swept away from the pelvic sidewall towards the endopelvic fascia, which was then carefully swept away in a interfascial dissection towards the apex of the prostate.  A mirror image dissection was performed on the left side mobilizing the left bladder and the prostate away from the pelvic sidewall.  Anterior attachments were taken down using cautery scissors taking great care to avoid bladder injury,which did not occur.  This exposed the base of the prostate, and periprostatic fat was  then separated from this location and set aside for permanent pathology. This exposed the area of the dorsal venous complex, which was controlled using endovascular load stapler, this was resulted in excellent hemostatic control without any evidence of urethral injury.  The bladder neck was identified moving the Foley catheter back and forth.  Incision was made in anterior-posterior location directly at the area of the bladder neck sweeping it away  from the base of the prostate. Posteriorly, incision was made approximately 7 mm inferior to the posterior lip of the bladder neck.  Dissection was proceeded directly inferoposterior and plane of Denonvilliers was encountered and in this space the bilateral vas and seminal vesicles were found.  The vas were dissected for a distance of 4 cm.  Ligated and placed in superior traction.  The bilateral seminal vesicles were dissected at the tip and placed on gentle superior traction.  The plane of Denonvilliers was carefully developed further in base to apex orientation.  This exposed the prostatic pedicles bilaterally.  Bilateral nerve sparing was performed sweeping what appeared to be neurovascular tissue away from the lateral and apical portions of the prostate bilaterally.  The pedicles were then controlled using sequential clipping and base to apex orientation with cold clips.  Membranous urethra was then controlled in anterior plane using cold scissors.  This completely freed up the prostatectomy specimens and placed in endoscopic retrieval bag.  Rectal exam was performed.  No evidence of rectal injury was noted.  Sponge was placed into the prostatectomy bed.  Attention was then directed to lymphadenectomy.  First on the right side, all fiber fatty tissue confined to the external iliac vein, obturator nerve, and pelvic side wall were carefully mobilized.  Hemostasis was achieved with Hem-o-Lok clips.  This packed was  set aside labeled right pelvic lymph node.  There was no obvious large adenopathy.  Obturator nerve inspected during and post lymphadenectomy was uninjured.  A mirror imaged lymphadenectomy was performed on left side.  Again with confines being the left external iliac vein, obturator nerve, pelvic side wall.  Left obturator nerve was uninjured.  Attention was then directed to posterior reconstruction, a 3- 0 V-Loc suture 6 inches was used to reapproximate the posterior  bladder placing the posterior urethral plate, bringing the bladder neck and urethral stump in a tension-free approximation.  Urethral-bladder anastomosis was then performed using double-armed V-Loc suture from the 6 o'clock to the 12 o'clock position, taking great care to ensure mucosa- to-mucosa anastomosis and no obstruction of the ureteral orifice, which was verified with direct vision.  The Foley catheter was then placed and irrigated quantitatively without evidence of leak.  The same anastomotic stitch was used to take a single bite from the previous puboprostatic ligament, thus performing anterior reconstruction as well. The previous left lateral robotic ports and the right 12-mm assist ports were closed over the fascia using 2-0 Vicryl and a Carter-Thomason suture passer under direct vision.  Robot was then undocked.  Specimen was retrieved by extending the camera port site for total distance approximately 4 cm and removed in its entirety setting aside for permanent pathology.  This extraction site was closed with fascia using figure-of-eight PDS x3.  All skin incisions were closed using subcuticular Monocryl followed by Dermabond.  Procedure was then terminated.  The patient tolerated the procedure well.  There were no immediate periprocedural complications.  The patient was taken to the postanesthesia care unit in stable condition.  ______________________________ Sebastian Ache, MD    TM/MEDQ  D:  03/18/2013  T:  03/19/2013  Job:  [id number]

## 2013-03-19 NOTE — Progress Notes (Signed)
Agree with NP note. Home in AM

## 2013-03-19 NOTE — Progress Notes (Signed)
1 Day Post-Op Subjective: Patient reports tolerating PO and pain control good. States he has "bad" night couldn't really get comfortable and had some nausea but this am pain is much better controlled and is tolerating regular diet. Denies passing flatus at this time.   Objective: Vital signs in last 24 hours: Temp:  [97.4 F (36.3 C)-98.5 F (36.9 C)] 98.5 F (36.9 C) (03/22 0535) Pulse Rate:  [78-117] 108 (03/22 0535) Resp:  [11-20] 16 (03/22 0535) BP: (101-153)/(40-90) 109/70 mmHg (03/22 0535) SpO2:  [95 %-100 %] 98 % (03/22 0535) Weight:  [73.2 kg (161 lb 6 oz)] 73.2 kg (161 lb 6 oz) (03/22 0535)  Intake/Output from previous day: 03/21 0701 - 03/22 0700 In: 6210 [P.O.:660; I.V.:4150; IV Piggyback:1400] Out: 4237 [Urine:3800; Drains:237; Blood:200] Intake/Output this shift:    Physical Exam:  General:alert, cooperative and no distress GI: soft and hypoactive bowel sounds. Incisions CDI with dermabond. No redness or swelling noted. JP drain intact w/scant output.  Male genitalia: Penis: normal, no lesions foley w/in meatus draining clear yellow urine Resp: clear to auscultation bilaterally Cardio: regular rate and rhythm  Lab Results:  Recent Labs  03/18/13 1046 03/19/13 0413  HGB 13.2 11.9*  HCT 38.4* 33.7*   BMET No results found for this basename: NA, K, CL, CO2, GLUCOSE, BUN, CREATININE, CALCIUM,  in the last 72 hours No results found for this basename: LABPT, INR,  in the last 72 hours No results found for this basename: LABURIN,  in the last 72 hours Results for orders placed during the hospital encounter of 03/08/13  SURGICAL PCR SCREEN     Status: None   Collection Time    03/08/13  1:47 PM      Result Value Range Status   MRSA, PCR NEGATIVE  NEGATIVE Final   Staphylococcus aureus NEGATIVE  NEGATIVE Final   Comment:            The Xpert SA Assay (FDA     approved for NASAL specimens     in patients over 21 years of age),     is one component of     a  comprehensive surveillance     program.  Test performance has     been validated by [employer] for patients greater     than or equal to 81 year old.     It is not intended     to diagnose infection nor to     guide or monitor treatment.    Studies/Results: No results found.  Assessment/Plan Status post robotic lap assisted prostatectomy 03/18/13 by Dr. Berneice Heinrich. Will change IV to NSL. Will begin po Oxycodone 5/325 mg 1-2 po Q4-6 hrs prn. Ambulate in hallway TID/QID today. Will check JP creatine in am. Anticipate d/c JP in am and discharge to home.   LOS: 1 day   [facility] 03/19/2013, 9:59 AM

## 2013-03-20 NOTE — Progress Notes (Signed)
2 Days Post-Op Subjective: Patient reports tolerating PO and pain control good. Denies any pain or N/V. States he is ready to go home.   Objective: Vital signs in last 24 hours: Temp:  [98.2 F (36.8 C)-98.5 F (36.9 C)] 98.4 F (36.9 C) (03/23 0502) Pulse Rate:  [101-107] 101 (03/23 0502) Resp:  [16-18] 16 (03/23 0502) BP: (113-128)/(61-78) 119/61 mmHg (03/23 0502) SpO2:  [97 %] 97 % (03/23 0502)  Intake/Output from previous day: 03/22 0701 - 03/23 0700 In: 400 [I.V.:400] Out: 2848 [Urine:2800; Drains:48] Intake/Output this shift:    Physical Exam:  General:alert, cooperative and no distress GI: soft, non tender, normal bowel sounds, no palpable masses, no organomegaly, no inguinal hernia. Incisions are CID. JP is intact draining scant discharge.  Male genitalia: foley w/in meatus draining clear yellow urine.   Lab Results:  Recent Labs  03/18/13 1046 03/19/13 0413  HGB 13.2 11.9*  HCT 38.4* 33.7*   BMET No results found for this basename: NA, K, CL, CO2, GLUCOSE, BUN, CREATININE, CALCIUM,  in the last 72 hours No results found for this basename: LABPT, INR,  in the last 72 hours No results found for this basename: LABURIN,  in the last 72 hours Results for orders placed during the hospital encounter of 03/08/13  SURGICAL PCR SCREEN     Status: None   Collection Time    03/08/13  1:47 PM      Result Value Range Status   MRSA, PCR NEGATIVE  NEGATIVE Final   Staphylococcus aureus NEGATIVE  NEGATIVE Final   Comment:            The Xpert SA Assay (FDA     approved for NASAL specimens     in patients over 32 years of age),     is one component of     a comprehensive surveillance     program.  Test performance has     been validated by [employer] for patients greater     than or equal to 39 year old.     It is not intended     to diagnose infection nor to     guide or monitor treatment.    Studies/Results: No results found.  Assessment/Plan: Status  post robotic lab assisted prostatectomy 03/18/13 by Dr. Berneice Heinrich. Will d/c home with both foley and JP.    LOS: 2 days   [facility] 03/20/2013, 8:29 AM

## 2013-03-20 NOTE — Discharge Summary (Signed)
Physician Discharge Summary  Patient ID: Dominic Oliver MRN: [id number] DOB/AGE: 04-01-1951 62 y.o.  Admit date: 03/18/2013 Discharge date: 03/20/2013  Admission Diagnoses: prostate cancer  Discharge Diagnoses:  Same Status post robotic lap assisted prostatectom 03/18/13  Discharged Condition: good. Pain well controlled. Incisions CDI with JP drain intact and foley draining clear yellow urine.  Hospital Course:   Consults: None  Significant Diagnostic Studies: labs: and radiology:   Treatments: IV hydration, antibiotics: Ancef, analgesia: Dilaudid and Oxycodone IR 5 mg and surgery:   Discharge Exam: Blood pressure 119/61, pulse 101, temperature 98.4 F (36.9 C), temperature source Oral, resp. rate 16, height 6' (1.829 m), weight 73.2 kg (161 lb 6 oz), SpO2 97.00%. General appearance: alert, cooperative and no distress Resp: clear to auscultation bilaterally GI: soft, non-tender; bowel sounds normal; no masses,  no organomegaly Male genitalia: normal, penis: no lesions or discharge. testes: no masses or tenderness. no hernias Incision/Wound: robotic lap sites CID with dermabond. JP drain intact with scant drainage  Disposition:      Medication List    TAKE these medications       amitriptyline 50 MG tablet  Commonly known as:  ELAVIL  Take 50 mg by mouth at bedtime.     celecoxib 200 MG capsule  Commonly known as:  CELEBREX  Take 200 mg by mouth 2 (two) times daily.     ciprofloxacin 500 MG tablet  Commonly known as:  CIPRO  Take 1 tablet (500 mg total) by mouth 2 (two) times daily. Start day prior to office visit for foley removal     HYDROcodone-acetaminophen 5-325 MG per tablet  Commonly known as:  NORCO  Take 1-2 tablets by mouth every 6 (six) hours as needed for pain.     omeprazole 40 MG capsule  Commonly known as:  PRILOSEC  Take 40 mg by mouth daily.           Follow-up Information   Follow up with Sebastian Ache, MD On 03/24/2013. (at 9:30)    Contact information:   [address] [place] [address] [phone]       Signed: Jetta Lout 03/20/2013, 8:36 AM

## 2013-03-21 ENCOUNTER — Encounter (HOSPITAL_COMMUNITY): Payer: Self-pay | Admitting: Urology

## 2013-10-26 ENCOUNTER — Encounter (INDEPENDENT_AMBULATORY_CARE_PROVIDER_SITE_OTHER): Payer: Self-pay | Admitting: General Surgery

## 2013-10-28 ENCOUNTER — Ambulatory Visit (INDEPENDENT_AMBULATORY_CARE_PROVIDER_SITE_OTHER): Payer: No Typology Code available for payment source | Admitting: General Surgery

## 2013-10-28 ENCOUNTER — Encounter (INDEPENDENT_AMBULATORY_CARE_PROVIDER_SITE_OTHER): Payer: Self-pay

## 2013-10-28 ENCOUNTER — Encounter (INDEPENDENT_AMBULATORY_CARE_PROVIDER_SITE_OTHER): Payer: Self-pay | Admitting: General Surgery

## 2013-10-28 VITALS — BP 110/62 | HR 84 | Temp 97.9°F | Resp 14 | Ht 72.0 in | Wt 162.0 lb

## 2013-10-28 DIAGNOSIS — K429 Umbilical hernia without obstruction or gangrene: Secondary | ICD-10-CM

## 2013-10-28 DIAGNOSIS — K4090 Unilateral inguinal hernia, without obstruction or gangrene, not specified as recurrent: Secondary | ICD-10-CM

## 2013-10-28 NOTE — Patient Instructions (Signed)
Hernia, Surgical Repair A hernia occurs when an internal organ pushes out through a weak spot in the belly (abdominal) wall muscles. Hernias commonly occur in the groin and around the navel. Hernias often can be pushed back into place (reduced). Most hernias tend to get worse over time. Problems occur when abdominal contents get stuck in the opening (incarcerated hernia). The blood supply gets cut off (strangulated hernia). This is an emergency and needs surgery. Otherwise, hernia repair can be an elective procedure. This means you can schedule this at your convenience when an emergency is not present. Because complications can occur, if you decide to repair the hernia, it is best to do it soon. When it becomes an emergency procedure, there is increased risk of complications after surgery. CAUSES   Heavy lifting.  Obesity.  Prolonged coughing.  Straining to move your bowels.  Hernias can also occur through a cut (incision) by a surgeonafter an abdominal operation. HOME CARE INSTRUCTIONS Before the repair:  Bed rest is not required. You may continue your normal activities, but avoid heavy lifting (more than 10 pounds) or straining. Cough gently. If you are a smoker, it is best to stop. Even the best hernia repair can break down with the continual strain of coughing.  Do not wear anything tight over your hernia. Do not try to keep it in with an outside bandage or truss. These can damage abdominal contents if they are trapped in the hernia sac.  Eat a normal diet. Avoid constipation. Straining over long periods of time to have a bowel movement will increase hernia size. It also can breakdown repairs. If you cannot do this with diet alone, laxatives or stool softeners may be used. PRIOR TO SURGERY, SEEK IMMEDIATE MEDICAL CARE IF: You have problems (symptoms) of a trapped (incarcerated) hernia. Symptoms include:  An oral temperature above 102 F (38.9 C) develops, or as your caregiver  suggests.  Increasing abdominal pain.  Feeling sick to your stomach(nausea) and vomiting.  You stop passing gas or stool.  The hernia is stuck outside the abdomen, looks discolored, feels hard, or is tender.  You have any changes in your bowel habits or in the hernia that is unusual for you. LET YOUR CAREGIVERS KNOW ABOUT THE FOLLOWING:  Allergies.  Medications taken including herbs, eye drops, over the counter medications, and creams.  Use of steroids (by mouth or creams).  Family or personal history of problems with anesthetics or Novocaine.  Possibility of pregnancy, if this applies.  Personal history of blood clots (thrombophlebitis).  Family or personal history of bleeding or blood problems.  Previous surgery.  Other health problems. BEFORE THE PROCEDURE You should be present 1 hour prior to your procedure, or as directed by your caregiver.  AFTER THE PROCEDURE After surgery, you will be taken to the recovery area. A nurse will watch and check your progress there. Once you are awake, stable, and taking fluids well, you will be allowed to go home as long as there are no problems. Once home, an ice pack (wrapped in a light towel) applied to your operative site may help with discomfort. It may also keep the swelling down. Do not lift anything heavier than 10 pounds (4.55 kilograms). Take showers not baths. Do not drive while taking narcotics. Follow instructions as suggested by your caregiver.  SEEK IMMEDIATE MEDICAL CARE IF: After surgery:  There is redness, swelling, or increasing pain in the wound.  There is pus coming from the wound.  There is   drainage from a wound lasting longer than 1 day.  An unexplained oral temperature above 102 F (38.9 C) develops.  You notice a foul smell coming from the wound or dressing.  There is a breaking open of a wound (edged not staying together) after the sutures have been removed.  You notice increasing pain in the shoulders  (shoulder strap areas).  You develop dizzy episodes or fainting while standing.  You develop persistent nausea or vomiting.  You develop a rash.  You have difficulty breathing.  You develop any reaction or side effects to medications given. MAKE SURE YOU:   Understand these instructions.  Will watch your condition.  Will get help right away if you are not doing well or get worse. Document Released: 06/10/2001 Document Revised: 03/08/2012 Document Reviewed: 05/02/2008 [employer] Patient Information 2014 [employer].  

## 2013-10-30 NOTE — Progress Notes (Signed)
Patient ID: Dominic Oliver, male   DOB: March 11, 1951, 62 y.o.   MRN: [id number]  Chief Complaint  Patient presents with  . New Evaluation    eval LIH    HPI Dominic Oliver is a 62 y.o. male.   HPI 62 year old Caucasian male referred by Dr. Ewing Schlein for evaluation of left inguinal hernia. Patient states that he started having some problems in mid July. He states that he would develop some left lower quadrant-inguinal discomfort and pain while having a bowel movement. He states that he would notice a little bit of swelling in the area. He states that he saw his primary care physician to that it may be diverticulitis and put him on 10 days of antibiotics. It helped a little bit but he still had ongoing discomfort. He describes the discomfort as if someone just grabs you all of a sudden in the groin. He denies any numbness, stinging or burning. He went to [facility] and had a CT scan which demonstrated a groin hernia. The patient has a history of prostate cancer and underwent robotic prostatectomy with pelvic lymphadenectomy. He does have some constipation and takes MiraLAX as needed. If he doesn't have a bowel movement after 2 days he will take the MiraLAX. He denies any fever, chills, nausea or vomiting. Past Medical History  Diagnosis Date  . Restless leg syndrome     amitripytline helps  . GERD (gastroesophageal reflux disease)   . Prostate cancer   . Arthritis     knees, spine, thumbs  . Peptic ulcer disease     Past Surgical History  Procedure Laterality Date  . Nasal polyp excision  1989  . Knee arthroscopy  10/2003  . Robot assisted laparoscopic radical prostatectomy N/A 03/18/2013    Procedure: ROBOTIC ASSISTED LAPAROSCOPIC RADICAL PROSTATECTOMY;  Surgeon: Sebastian Ache, MD;  Location: [facility];  Service: Urology;  Laterality: N/A;  . Lymphadenectomy Bilateral 03/18/2013    Procedure: LYMPHADENECTOMY;  Surgeon: Sebastian Ache, MD;  Location: [facility];  Service: Urology;  Laterality:  Bilateral;    Family History  Problem Relation Age of Onset  . Colon polyps Father     Social History History  Substance Use Topics  . Smoking status: Former Smoker -- 0.25 packs/day for 30 years  . Smokeless tobacco: Never Used     Comment: quit smoking oct 2000  . Alcohol Use: Yes     Comment: rare beer    No Known Allergies  Current Outpatient Prescriptions  Medication Sig Dispense Refill  . amitriptyline (ELAVIL) 50 MG tablet Take 50 mg by mouth at bedtime.      Marland Kitchen aspirin 81 MG tablet Take 81 mg by mouth daily.      Marland Kitchen omeprazole (PRILOSEC) 40 MG capsule Take 40 mg by mouth daily.      . solifenacin (VESICARE) 5 MG tablet Take 10 mg by mouth daily.       No current facility-administered medications for this visit.    Review of Systems Review of Systems  Constitutional: Negative for fever, chills, appetite change and unexpected weight change.  HENT: Negative for congestion and trouble swallowing.   Eyes: Negative for visual disturbance.  Respiratory: Negative for chest tightness and shortness of breath.   Cardiovascular: Negative for chest pain and leg swelling.       No PND, no orthopnea, no DOE  Gastrointestinal: Positive for constipation. Negative for nausea, vomiting, abdominal pain and diarrhea.       See HPI  Genitourinary:  Negative for dysuria and hematuria.       Some nocturia, hesistancy, frequency, leakage  Musculoskeletal: Negative.   Skin: Negative for rash.  Neurological: Negative for dizziness, seizures, facial asymmetry and speech difficulty.       Denies TIAs and amaurosis fugax  Hematological: Does not bruise/bleed easily.  Psychiatric/Behavioral: Negative for behavioral problems and confusion.    Blood pressure 110/62, pulse 84, temperature 97.9 F (36.6 C), temperature source Temporal, resp. rate 14, height 6' (1.829 m), weight 162 lb (73.483 kg).  Physical Exam Physical Exam  Constitutional: He is oriented to person, place, and time. He  appears well-developed and well-nourished. No distress.  HENT:  Head: Normocephalic and atraumatic.  Right Ear: External ear normal.  Left Ear: External ear normal.  Eyes: Conjunctivae are normal. No scleral icterus.  Neck: Normal range of motion. Neck supple. No tracheal deviation present. No thyromegaly present.  Cardiovascular: Normal rate, normal heart sounds and intact distal pulses.   Pulmonary/Chest: Effort normal and breath sounds normal. No respiratory distress. He has no wheezes.  Abdominal: Soft. He exhibits no distension. There is no tenderness. There is no rebound and no guarding. A hernia is present. Hernia confirmed positive in the left inguinal area. Hernia confirmed negative in the right inguinal area.    Genitourinary: Testes normal and penis normal.  Reducible LIH; examined supine and standing; mildly TTP on deep palpation of inguinal ring  Musculoskeletal: Normal range of motion. He exhibits no edema and no tenderness.  Lymphadenopathy:    He has no cervical adenopathy.  Neurological: He is alert and oriented to person, place, and time. He exhibits normal muscle tone.  Skin: Skin is warm and dry. No rash noted. He is not diaphoretic. No erythema. No pallor.  Psychiatric: He has a normal mood and affect. His behavior is normal. Judgment and thought content normal.    Data Reviewed CT scan report from 10/19/13 - small LIH with fat; s/p prostatectomy Dr Marlane Hatcher note 10/24/13; Colonoscopy report  Assessment    Left inguinal hernia     Plan    We discussed the etiology of inguinal hernias. We discussed the signs & symptoms of incarceration & strangulation.  We discussed non-operative and operative management. Because he has had a robotic prostatectomy as well as a pelvic dissection I recommended an open approach as opposed to laparoscopic approach  The patient has elected To proceed with an open repair of a left inguinal hernia with mesh   I described the  procedure in detail.  The patient was given educational material. We discussed the risks and benefits including but not limited to bleeding, infection, chronic inguinal pain, nerve entrapment, hernia recurrence, mesh complications, hematoma formation, urinary retention, injury to the testicles, numbness in the groin, blood clots, injury to the surrounding structures, and anesthesia risk. We also discussed the typical post operative recovery course, including no heavy lifting for 4-6 weeks. I explained that the likelihood of improvement of their symptoms is Good.  He is on Vesicare. I do believe that he is at an increased risk for postoperative urinary retention. I will send an e-mail to Dr. Berneice Heinrich asking him about whether or not he should stop the Vesicare preoperatively.  He will be scheduled for surgery in the near future.  Mary Sella. Andrey Campanile, MD, FACS General, Bariatric, & Minimally Invasive Surgery [facility], [place]          [facility] M 10/30/2013, 11:07 AM

## 2013-12-16 ENCOUNTER — Other Ambulatory Visit (INDEPENDENT_AMBULATORY_CARE_PROVIDER_SITE_OTHER): Payer: Self-pay | Admitting: *Deleted

## 2013-12-16 DIAGNOSIS — K4090 Unilateral inguinal hernia, without obstruction or gangrene, not specified as recurrent: Secondary | ICD-10-CM

## 2013-12-16 MED ORDER — OXYCODONE-ACETAMINOPHEN 5-325 MG PO TABS: 1.0000 | ORAL_TABLET | ORAL | Status: DC | PRN

## 2013-12-20 ENCOUNTER — Encounter (INDEPENDENT_AMBULATORY_CARE_PROVIDER_SITE_OTHER): Payer: Self-pay

## 2014-01-02 ENCOUNTER — Encounter (INDEPENDENT_AMBULATORY_CARE_PROVIDER_SITE_OTHER): Payer: Self-pay

## 2014-01-12 ENCOUNTER — Ambulatory Visit (INDEPENDENT_AMBULATORY_CARE_PROVIDER_SITE_OTHER): Payer: Commercial Indemnity | Admitting: General Surgery

## 2014-01-12 ENCOUNTER — Encounter (INDEPENDENT_AMBULATORY_CARE_PROVIDER_SITE_OTHER): Payer: Self-pay | Admitting: General Surgery

## 2014-01-12 VITALS — BP 123/79 | HR 74 | Temp 98.1°F | Resp 18 | Ht 72.0 in | Wt 164.4 lb

## 2014-01-12 DIAGNOSIS — Z09 Encounter for follow-up examination after completed treatment for conditions other than malignant neoplasm: Secondary | ICD-10-CM

## 2014-01-12 NOTE — Progress Notes (Signed)
Subjective:     Patient ID: Dominic Oliver, male   DOB: 04/28/1951, 63 y.o.   MRN: [id number]  HPI 63 year old Caucasian male comes in for followup after going open repair of a left indirect inguinal hernia with mesh on December 19. He states he is doing well. He has 1 area of soreness when he is getting into the car. Otherwise he denies any burning senior shooting pain in his groin. He is artery return to work. He denies any diarrhea or constipation.  Review of Systems     Objective:   Physical Exam BP 123/79  Pulse 74  Temp(Src) 98.1 F (36.7 C) (Temporal)  Resp 18  Ht 6' (1.829 m)  Wt 164 lb 6.4 oz (74.571 kg)  BMI 22.29 kg/m2 Alert, nad Well-healed inguinal incision. No cellulitis, induration or fluctuance. No evidence of hernia recurrence. No hematoma or seroma. Both testicles descended.    Assessment:     Status post open repair of left indirect inguinal hernia with mesh     Plan:     Doing well. I told him he could gradually start resuming full activities this coming Monday. I reassured him that the area of soreness should continued to improve And will eventually resolved. Followup prn  [name]. [name], MD, FACS General, Bariatric, & Minimally Invasive Surgery [facility], [place]

## 2014-01-12 NOTE — Patient Instructions (Signed)
Can gradually resume full activities starting Monday

## 2014-08-24 ENCOUNTER — Other Ambulatory Visit (HOSPITAL_COMMUNITY): Payer: Self-pay | Admitting: Family Medicine

## 2014-08-24 ENCOUNTER — Ambulatory Visit (HOSPITAL_COMMUNITY)
Admission: RE | Admit: 2014-08-24 | Discharge: 2014-08-24 | Disposition: A | Discharge status: Home / Self Care | Payer: Managed Care, Other (non HMO) | Source: Ambulatory Visit | Attending: Family Medicine | Admitting: Family Medicine

## 2014-08-24 DIAGNOSIS — K5730 Diverticulosis of large intestine without perforation or abscess without bleeding: Secondary | ICD-10-CM | POA: Insufficient documentation

## 2014-08-24 DIAGNOSIS — K5732 Diverticulitis of large intestine without perforation or abscess without bleeding: Secondary | ICD-10-CM

## 2014-08-24 DIAGNOSIS — Z9079 Acquired absence of other genital organ(s): Secondary | ICD-10-CM | POA: Diagnosis not present

## 2014-08-24 DIAGNOSIS — R1031 Right lower quadrant pain: Secondary | ICD-10-CM | POA: Diagnosis present

## 2014-08-24 MED ORDER — IOHEXOL 300 MG/ML  SOLN
100.0000 mL | Freq: Once | INTRAMUSCULAR | Status: AC | PRN
  Administered 2014-08-24: 100 mL via INTRAVENOUS

## 2014-08-31 ENCOUNTER — Encounter (INDEPENDENT_AMBULATORY_CARE_PROVIDER_SITE_OTHER): Payer: Commercial Indemnity | Admitting: General Surgery

## 2014-09-25 ENCOUNTER — Other Ambulatory Visit (INDEPENDENT_AMBULATORY_CARE_PROVIDER_SITE_OTHER): Payer: Self-pay | Admitting: General Surgery

## 2014-09-25 NOTE — H&P (Signed)
History of Present Illness Dominic Oliver M. Per Beagley MD; 09/13/2014 1:27 PM) Patient words: hernia.  The patient is a 63 year old male who presents with an inguinal hernia. The hernia(s) is/are located on the right side. Symptoms include inguinal pain. The pain is located in the right inguinal area. The pain radiates to the right inguinal area and right hemiscrotum. The patient describes the pain as sharp, burning and stinging. Onset was gradual 6 week(s) ago. The symptoms occur during the day. The episodes occur daily. The patient describes this as worsening. Symptoms are exacerbated by straining, lifting and bending. Symptoms are relieved by recumbency. Associated symptoms do not include nausea, vomiting, constipation, obstipation or fever. The patient is not currently being treated for this problem. The patient was previously evaluated by a primary physician. Past evaluation has included CT scan of the abdomen.  pt s/p open repair of LIH with mesh by me 12/16/2013 comes in c/o knot in his right groin for past 6 weeks. while lying down no symptoms. but as day progresses he develops severe burning, stinging, pain in right groin that radiates to inner thigh. 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 saw PCP and Dr Watt Climes.   Other Problems Dominic Oliver, [place]; 09/13/2014 12:36 PM) Arthritis Back Pain Gastroesophageal Reflux Disease Inguinal Hernia Prostate Cancer  Past Surgical History Dominic Oliver, [place]; 09/13/2014 12:36 PM) Knee Surgery Right. Open Inguinal Hernia Surgery Left. Prostate Surgery - Removal  Diagnostic Studies History Dominic Oliver, [place]; 09/13/2014 12:36 PM) Colonoscopy 1-5 years ago  Allergies Dominic Oliver, [place]; 09/13/2014 12:36 PM) No Known Drug Allergies09/16/2015  Medication History Dominic Oliver, [place]; 09/13/2014 12:38 PM) Amitriptyline HCl (50MG  Tablet, Oral) Active. Omeprazole (40MG  Capsule DR, Oral) Active. Aspirin EC  (81MG  Tablet DR, Oral) Active.  Social History Dominic Oliver, [place]; 09/13/2014 12:36 PM) Alcohol use Occasional alcohol use. Caffeine use Coffee. No drug use Tobacco use Former smoker.  Family History Dominic Oliver, [place]; 09/13/2014 12:36 PM) Arthritis Daughter, Father, Sister. Breast Cancer Mother. Cerebrovascular Accident Father. Colon Polyps Father. Hypertension Father. Melanoma Father. Prostate Cancer Father.  Review of Systems (Dominic Oliver; 09/13/2014 12:36 PM) General Present- Fatigue. Not Present- Appetite Loss, Chills, Fever, Night Sweats, Weight Gain and Weight Loss. Skin Not Present- Change in Wart/Mole, Dryness, Hives, Jaundice, New Lesions, Non-Healing Wounds, Rash and Ulcer. HEENT Present- Hearing Loss, Ringing in the Ears, Seasonal Allergies and Wears glasses/contact lenses. Not Present- Earache, Hoarseness, Nose Bleed, Oral Ulcers, Sinus Pain, Sore Throat, Visual Disturbances and Yellow Eyes. Respiratory Not Present- Bloody sputum, Chronic Cough, Difficulty Breathing, Snoring and Wheezing. Breast Not Present- Breast Mass, Breast Pain, Nipple Discharge and Skin Changes. Cardiovascular Not Present- Chest Pain, Difficulty Breathing Lying Down, Leg Cramps, Palpitations, Rapid Heart Rate, Shortness of Breath and Swelling of Extremities. Gastrointestinal Present- Abdominal Pain. Not Present- Bloating, Bloody Stool, Change in Bowel Habits, Chronic diarrhea, Constipation, Difficulty Swallowing, Excessive gas, Gets full quickly at meals, Hemorrhoids, Indigestion, Nausea, Rectal Pain and Vomiting. Male Genitourinary Present- Impotence. Not Present- Blood in Urine, Change in Urinary Stream, Frequency, Nocturia, Painful Urination, Urgency and Urine Leakage. Musculoskeletal Present- Back Pain. Not Present- Joint Pain, Joint Stiffness, Muscle Pain, Muscle Weakness and Swelling of Extremities. Neurological Not Present- Decreased Memory, Fainting, Headaches,  Numbness, Seizures, Tingling, Tremor, Trouble walking and Weakness. Psychiatric Not Present- Anxiety, Bipolar, Change in Sleep Pattern, Depression, Fearful and Frequent crying. Endocrine Not Present- Cold Intolerance, Excessive Hunger, Hair Changes, Heat Intolerance,  Hot flashes and New Diabetes. Hematology Present- Easy Bruising. Not Present- Excessive bleeding, Gland problems, HIV and Persistent Infections.   Vitals Dominic Oliver; 09/13/2014 12:36 PM) 09/13/2014 12:36 PM Weight: 155.25 lb Height: 72in Body Surface Area: 1.89 m Body Mass Index: 21.06 kg/m Pulse: 78 (Regular)  BP: 120/78 (Sitting, Right Arm, Standard)    Physical Exam Dominic Oliver M. Dominic Hopwood MD; 09/13/2014 1:30 PM) General Mental Status-Alert. General Appearance-Consistent with stated age. Hydration-Well hydrated. Voice-Normal.  Head and Neck Head-normocephalic, atraumatic with no lesions or palpable masses. Trachea-midline. Thyroid Gland Characteristics - normal size and consistency.  Eye Eyeball - Bilateral-Extraocular movements intact. Sclera/Conjunctiva - Bilateral-No scleral icterus.  Chest and Lung Exam Chest and lung exam reveals -quiet, even and easy respiratory effort with no use of accessory muscles, normal resonance, no flatness or dullness and on auscultation, normal breath sounds, no adventitious sounds and normal vocal resonance. Inspection Chest Wall - Normal. Back - normal.  Breast Breast - Left-Symmetric, Non Tender, No Biopsy scars, no Dimpling, No Inflammation, No Lumpectomy scars, No Mastectomy scars, No Peau d' Orange. Breast - Right-Symmetric, Non Tender, No Biopsy scars, no Dimpling, No Inflammation, No Lumpectomy scars, No Mastectomy scars, No Peau d' Orange. Breast Lump-No Palpable Breast Mass.  Cardiovascular Cardiovascular examination reveals -on palpation PMI is normal in location and amplitude, no palpable S3 or S4. Normal cardiac borders.,  normal heart sounds, regular rate and rhythm with no murmurs, carotid auscultation reveals no bruits and normal pedal pulses bilaterally.  Abdomen Inspection Skin - Scar - no surgical scars. Hernias - Diastasis recti - Present. Inguinal hernia - Right - Reducible. Incisional scars - Note: old L inguinal scar; no evidence of LIH; small tiny umbilical hernia (<5KD). Palpation/Percussion Palpation and Percussion of the abdomen reveal - Soft, Non Tender, No Rebound tenderness, No Rigidity (guarding) and No hepatosplenomegaly. Auscultation Auscultation of the abdomen reveals - Bowel sounds normal.  Rectal Anorectal Exam Internal - normal internal exam and normal sphincter tone. Proctoscopic exam-No Internal hemorrhoids.  Peripheral Vascular Upper Extremity Inspection - Bilateral - Normal - No Clubbing, No Cyanosis, No Edema, Pulses Intact. Palpation - Pulses bilaterally normal. Lower Extremity Palpation - Pulses bilaterally normal.  Neurologic Neurologic evaluation reveals -alert and oriented x 3 with no impairment of recent or remote memory. Mental Status-Normal.  Musculoskeletal Normal Exam - Left-Upper Extremity Strength Normal and Lower Extremity Strength Normal. Normal Exam - Right-Upper Extremity Strength Normal, Lower Extremity Weakness.  Lymphatic Head & Neck  General Head & Neck Lymphatics: Bilateral - Description - Normal. Femoral & Inguinal  Generalized Femoral & Inguinal Lymphatics: Bilateral - Description - Normal. Tenderness - Non Tender.    Assessment & Plan Dominic Oliver M. Somaly Marteney MD; 09/13/2014 1:31 PM) RIGHT INGUINAL HERNIA (550.90  K40.90) Impression: We discussed the etiology of inguinal hernias. We discussed the signs & symptoms of incarceration & strangulation. We discussed non-operative and operative management.  The patient has elected to proceed with OPEN REPAIR OF RIGHT INGUINAL HERNIA WITH MESH  I described the procedure in detail. The patient was  given educational material. We discussed the risks and benefits including but not limited to bleeding, infection, chronic inguinal pain, nerve entrapment, hernia recurrence, mesh complications, hematoma formation, urinary retention, injury to the testicles or the ovaries, numbness in the groin, blood clots, injury to the surrounding structures, and anesthesia risk. We also discussed the typical post operative recovery course, including no heavy lifting for 4-6 weeks. I explained that the likelihood of improvement of their symptoms is fair  to good. Current Plans  Schedule for Surgery Started Flomax 0.4MG , 1 (one) Capsule daily, #7, 7 days starting 09/13/2014, No Refill. Started OxyCODONE HCl 5MG , 1 (one) Tablet every four hours, as needed, #40, 09/13/2014, No Refill.

## 2014-09-25 NOTE — Progress Notes (Signed)
Surgery on 09/29/2014.  Preop on 09/28/2014 at 0800am.   Need orders in EPIc.  Thank You.

## 2014-09-26 NOTE — Patient Instructions (Addendum)
Dominic Oliver  09/26/2014                           YOUR PROCEDURE IS SCHEDULED ON:  09/29/14               ENTER THRU [facility] ENTRANCE AND                            FOLLOW  SIGNS TO SHORT STAY CENTER                 ARRIVE AT SHORT STAY AT:  5:30 am               CALL THIS NUMBER IF ANY PROBLEMS THE DAY OF SURGERY :               [phone]                                REMEMBER:   Do not eat food or drink liquids AFTER MIDNIGHT                  Take these medicines the morning of surgery with               A SIPS OF WATER :    prilosec     Do not wear jewelry, make-up   Do not wear lotions, powders, or perfumes.   Do not shave legs or underarms 12 hrs. before surgery (men may shave face)  Do not bring valuables to the hospital.  Contacts, dentures or bridgework may not be worn into surgery.  Leave suitcase in the car. After surgery it may be brought to your room.  For patients admitted to the hospital more than one night, checkout time is            11:00 AM                                                       The day of discharge.   Patients discharged the day of surgery will not be allowed to drive home.            If going home same day of surgery, must have someone stay with you              FIRST 24 hrs at home and arrange for some one to drive you              home from hospital.   ________________________________________________________________________                                                                                                  [place]  Before surgery, you can play an important role.  Because  skin is not sterile, your skin needs to be as free of germs as possible.  You can reduce the number of germs on your skin by washing with CHG (chlorahexidine gluconate) soap before surgery.  CHG is an antiseptic cleaner which kills germs and bonds with the skin to continue killing germs even  after washing. Please DO NOT use if you have an allergy to CHG or antibacterial soaps.  If your skin becomes reddened/irritated stop using the CHG and inform your nurse when you arrive at Short Stay. Do not shave (including legs and underarms) for at least 48 hours prior to the first CHG shower.  You may shave your face. Please follow these instructions carefully:   1.  Shower with CHG Soap the night before surgery and the  morning of Surgery.   2.  If you choose to wash your hair, wash your hair first as usual with your  normal  Shampoo.   3.  After you shampoo, rinse your hair and body thoroughly to remove the  shampoo.                                         4.  Use CHG as you would any other liquid soap.  You can apply chg directly  to the skin and wash . Gently wash with scrungie or clean wascloth    5.  Apply the CHG Soap to your body ONLY FROM THE NECK DOWN.   Do not use on open                           Wound or open sores. Avoid contact with eyes, ears mouth and genitals (private parts).                        Genitals (private parts) with your normal soap.              6.  Wash thoroughly, paying special attention to the area where your surgery  will be performed.   7.  Thoroughly rinse your body with warm water from the neck down.   8.  DO NOT shower/wash with your normal soap after using and rinsing off  the CHG Soap .                9.  Pat yourself dry with a clean towel.             10.  Wear clean pajamas.             11.  Place clean sheets on your bed the night of your first shower and do not  sleep with pets.  Day of Surgery : Do not apply any lotions/deodorants the morning of surgery.  Please wear clean clothes to the hospital/surgery center.  FAILURE TO FOLLOW THESE INSTRUCTIONS MAY RESULT IN THE CANCELLATION OF YOUR SURGERY    PATIENT SIGNATURE_________________________________  ______________________________________________________________________

## 2014-09-27 ENCOUNTER — Encounter (HOSPITAL_COMMUNITY): Payer: Self-pay | Admitting: Pharmacy Technician

## 2014-09-28 ENCOUNTER — Encounter (HOSPITAL_COMMUNITY): Payer: Self-pay

## 2014-09-28 ENCOUNTER — Encounter (HOSPITAL_COMMUNITY)
Admission: RE | Admit: 2014-09-28 | Discharge: 2014-09-28 | Disposition: A | Discharge status: Home / Self Care | Payer: Managed Care, Other (non HMO) | Source: Ambulatory Visit | Attending: General Surgery | Admitting: General Surgery

## 2014-09-28 ENCOUNTER — Encounter (INDEPENDENT_AMBULATORY_CARE_PROVIDER_SITE_OTHER): Payer: Self-pay

## 2014-09-28 DIAGNOSIS — M1990 Unspecified osteoarthritis, unspecified site: Secondary | ICD-10-CM | POA: Diagnosis not present

## 2014-09-28 DIAGNOSIS — K4090 Unilateral inguinal hernia, without obstruction or gangrene, not specified as recurrent: Secondary | ICD-10-CM | POA: Diagnosis not present

## 2014-09-28 DIAGNOSIS — Z8546 Personal history of malignant neoplasm of prostate: Secondary | ICD-10-CM | POA: Diagnosis not present

## 2014-09-28 DIAGNOSIS — K219 Gastro-esophageal reflux disease without esophagitis: Secondary | ICD-10-CM | POA: Diagnosis not present

## 2014-09-28 HISTORY — DX: Diverticulitis of intestine, part unspecified, without perforation or abscess without bleeding: K57.92

## 2014-09-28 LAB — CBC WITH DIFFERENTIAL/PLATELET
BASOS PCT: 0 % (ref 0–1)
Basophils Absolute: 0 10*3/uL (ref 0.0–0.1)
Eosinophils Absolute: 0.1 10*3/uL (ref 0.0–0.7)
Eosinophils Relative: 2 % (ref 0–5)
HEMATOCRIT: 44.2 % (ref 39.0–52.0)
Hemoglobin: 15.2 g/dL (ref 13.0–17.0)
LYMPHS ABS: 1.5 10*3/uL (ref 0.7–4.0)
Lymphocytes Relative: 28 % (ref 12–46)
MCH: 31 pg (ref 26.0–34.0)
MCHC: 34.4 g/dL (ref 30.0–36.0)
MCV: 90.2 fL (ref 78.0–100.0)
Monocytes Absolute: 0.5 10*3/uL (ref 0.1–1.0)
Monocytes Relative: 10 % (ref 3–12)
NEUTROS ABS: 3.2 10*3/uL (ref 1.7–7.7)
NEUTROS PCT: 60 % (ref 43–77)
Platelets: 253 10*3/uL (ref 150–400)
RBC: 4.9 MIL/uL (ref 4.22–5.81)
RDW: 12.1 % (ref 11.5–15.5)
WBC: 5.3 10*3/uL (ref 4.0–10.5)

## 2014-09-29 ENCOUNTER — Encounter (HOSPITAL_COMMUNITY): Payer: Self-pay | Admitting: *Deleted

## 2014-09-29 ENCOUNTER — Ambulatory Visit (HOSPITAL_COMMUNITY): Payer: Managed Care, Other (non HMO) | Admitting: Anesthesiology

## 2014-09-29 ENCOUNTER — Encounter (HOSPITAL_COMMUNITY)
Admission: RE | Disposition: A | Discharge status: Home / Self Care | Payer: Self-pay | Source: Ambulatory Visit | Attending: General Surgery

## 2014-09-29 ENCOUNTER — Ambulatory Visit (HOSPITAL_COMMUNITY)
Admission: RE | Admit: 2014-09-29 | Discharge: 2014-09-29 | Disposition: A | Discharge status: Home / Self Care | Payer: Managed Care, Other (non HMO) | Source: Ambulatory Visit | Attending: General Surgery | Admitting: General Surgery

## 2014-09-29 ENCOUNTER — Encounter (HOSPITAL_COMMUNITY): Payer: Managed Care, Other (non HMO) | Admitting: Anesthesiology

## 2014-09-29 DIAGNOSIS — Z8546 Personal history of malignant neoplasm of prostate: Secondary | ICD-10-CM | POA: Insufficient documentation

## 2014-09-29 DIAGNOSIS — M1990 Unspecified osteoarthritis, unspecified site: Secondary | ICD-10-CM | POA: Insufficient documentation

## 2014-09-29 DIAGNOSIS — K219 Gastro-esophageal reflux disease without esophagitis: Secondary | ICD-10-CM | POA: Insufficient documentation

## 2014-09-29 DIAGNOSIS — K4090 Unilateral inguinal hernia, without obstruction or gangrene, not specified as recurrent: Secondary | ICD-10-CM | POA: Insufficient documentation

## 2014-09-29 HISTORY — PX: INSERTION OF MESH: SHX5868

## 2014-09-29 HISTORY — PX: INGUINAL HERNIA REPAIR: SHX194

## 2014-09-29 SURGERY — REPAIR, HERNIA, INGUINAL, ADULT
Anesthesia: General | Site: Groin | Laterality: Right

## 2014-09-29 MED ORDER — LIDOCAINE HCL (CARDIAC) 20 MG/ML IV SOLN
INTRAVENOUS | Status: AC
  Filled 2014-09-29: qty 5

## 2014-09-29 MED ORDER — DEXAMETHASONE SODIUM PHOSPHATE 10 MG/ML IJ SOLN
INTRAMUSCULAR | Status: DC | PRN
  Administered 2014-09-29: 10 mg via INTRAVENOUS

## 2014-09-29 MED ORDER — MIDAZOLAM HCL 5 MG/5ML IJ SOLN
INTRAMUSCULAR | Status: DC | PRN
  Administered 2014-09-29: 2 mg via INTRAVENOUS

## 2014-09-29 MED ORDER — KETOROLAC TROMETHAMINE 15 MG/ML IJ SOLN: 15.0000 mg | Freq: Four times a day (QID) | INTRAMUSCULAR | Status: DC

## 2014-09-29 MED ORDER — CEFAZOLIN SODIUM-DEXTROSE 2-3 GM-% IV SOLR
2.0000 g | INTRAVENOUS | Status: AC
Start: 2014-09-29 — End: 2014-09-29
  Administered 2014-09-29: 2 g via INTRAVENOUS

## 2014-09-29 MED ORDER — PROPOFOL 10 MG/ML IV BOLUS
INTRAVENOUS | Status: AC
  Filled 2014-09-29: qty 20

## 2014-09-29 MED ORDER — OXYCODONE-ACETAMINOPHEN 5-325 MG PO TABS: 1.0000 | ORAL_TABLET | ORAL | Status: DC | PRN

## 2014-09-29 MED ORDER — NEOSTIGMINE METHYLSULFATE 10 MG/10ML IV SOLN
INTRAVENOUS | Status: AC
  Filled 2014-09-29: qty 1

## 2014-09-29 MED ORDER — ONDANSETRON HCL 4 MG/2ML IJ SOLN
INTRAMUSCULAR | Status: DC | PRN
  Administered 2014-09-29: 4 mg via INTRAVENOUS

## 2014-09-29 MED ORDER — BUPIVACAINE-EPINEPHRINE (PF) 0.25% -1:200000 IJ SOLN
INTRAMUSCULAR | Status: AC
  Filled 2014-09-29: qty 30

## 2014-09-29 MED ORDER — HYDROMORPHONE HCL 1 MG/ML IJ SOLN: 0.2500 mg | INTRAMUSCULAR | Status: DC | PRN

## 2014-09-29 MED ORDER — CHLORHEXIDINE GLUCONATE 4 % EX LIQD: 1.0000 "application " | Freq: Once | CUTANEOUS | Status: DC

## 2014-09-29 MED ORDER — ACETAMINOPHEN 650 MG RE SUPP
650.0000 mg | RECTAL | Status: DC | PRN
  Filled 2014-09-29: qty 1

## 2014-09-29 MED ORDER — SODIUM CHLORIDE 0.9 % IJ SOLN: 3.0000 mL | Freq: Two times a day (BID) | INTRAMUSCULAR | Status: DC

## 2014-09-29 MED ORDER — PROMETHAZINE HCL 25 MG/ML IJ SOLN: 6.2500 mg | INTRAMUSCULAR | Status: DC | PRN

## 2014-09-29 MED ORDER — ACETAMINOPHEN 325 MG PO TABS: 650.0000 mg | ORAL_TABLET | ORAL | Status: DC | PRN

## 2014-09-29 MED ORDER — FENTANYL CITRATE 0.05 MG/ML IJ SOLN
INTRAMUSCULAR | Status: DC | PRN
  Administered 2014-09-29: 50 ug via INTRAVENOUS

## 2014-09-29 MED ORDER — GLYCOPYRROLATE 0.2 MG/ML IJ SOLN
INTRAMUSCULAR | Status: DC | PRN
  Administered 2014-09-29: 0.6 mg via INTRAVENOUS

## 2014-09-29 MED ORDER — OXYCODONE HCL 5 MG PO TABS: 5.0000 mg | ORAL_TABLET | ORAL | Status: DC | PRN

## 2014-09-29 MED ORDER — CEFAZOLIN SODIUM-DEXTROSE 2-3 GM-% IV SOLR
INTRAVENOUS | Status: AC
  Filled 2014-09-29: qty 50

## 2014-09-29 MED ORDER — PROPOFOL 10 MG/ML IV BOLUS
INTRAVENOUS | Status: DC | PRN
  Administered 2014-09-29: 200 mg via INTRAVENOUS

## 2014-09-29 MED ORDER — MORPHINE SULFATE 10 MG/ML IJ SOLN: 1.0000 mg | INTRAMUSCULAR | Status: DC | PRN

## 2014-09-29 MED ORDER — LACTATED RINGERS IV SOLN
INTRAVENOUS | Status: DC | PRN
  Administered 2014-09-29: 07:00:00 via INTRAVENOUS

## 2014-09-29 MED ORDER — LIDOCAINE HCL (CARDIAC) 20 MG/ML IV SOLN
INTRAVENOUS | Status: DC | PRN
  Administered 2014-09-29: 100 mg via INTRAVENOUS

## 2014-09-29 MED ORDER — MIDAZOLAM HCL 2 MG/2ML IJ SOLN
INTRAMUSCULAR | Status: AC
  Filled 2014-09-29: qty 2

## 2014-09-29 MED ORDER — DEXAMETHASONE SODIUM PHOSPHATE 10 MG/ML IJ SOLN
INTRAMUSCULAR | Status: AC
  Filled 2014-09-29: qty 1

## 2014-09-29 MED ORDER — SODIUM CHLORIDE 0.9 % IV SOLN
250.0000 mL | INTRAVENOUS | Status: DC | PRN
Start: 2014-09-29 — End: 2014-09-29

## 2014-09-29 MED ORDER — GLYCOPYRROLATE 0.2 MG/ML IJ SOLN
INTRAMUSCULAR | Status: AC
  Filled 2014-09-29: qty 3

## 2014-09-29 MED ORDER — SUCCINYLCHOLINE CHLORIDE 20 MG/ML IJ SOLN
INTRAMUSCULAR | Status: DC | PRN
  Administered 2014-09-29: 100 mg via INTRAVENOUS

## 2014-09-29 MED ORDER — OXYCODONE-ACETAMINOPHEN 5-325 MG PO TABS
1.0000 | ORAL_TABLET | ORAL | Status: DC | PRN
  Administered 2014-09-29: 1 via ORAL
  Filled 2014-09-29: qty 1

## 2014-09-29 MED ORDER — SODIUM CHLORIDE 0.9 % IJ SOLN: 3.0000 mL | INTRAMUSCULAR | Status: DC | PRN

## 2014-09-29 MED ORDER — ONDANSETRON HCL 4 MG/2ML IJ SOLN
INTRAMUSCULAR | Status: AC
  Filled 2014-09-29: qty 2

## 2014-09-29 MED ORDER — ROCURONIUM BROMIDE 100 MG/10ML IV SOLN
INTRAVENOUS | Status: DC | PRN
  Administered 2014-09-29: 40 mg via INTRAVENOUS
  Administered 2014-09-29: 10 mg via INTRAVENOUS

## 2014-09-29 MED ORDER — BUPIVACAINE-EPINEPHRINE 0.25% -1:200000 IJ SOLN
INTRAMUSCULAR | Status: DC | PRN
Start: 2014-09-29 — End: 2014-09-29
  Administered 2014-09-29: 10 mL
  Administered 2014-09-29: 20 mL

## 2014-09-29 MED ORDER — FENTANYL CITRATE 0.05 MG/ML IJ SOLN
INTRAMUSCULAR | Status: AC
  Filled 2014-09-29: qty 5

## 2014-09-29 MED ORDER — KETOROLAC TROMETHAMINE 30 MG/ML IJ SOLN: 15.0000 mg | Freq: Once | INTRAMUSCULAR | Status: DC | PRN

## 2014-09-29 MED ORDER — NEOSTIGMINE METHYLSULFATE 10 MG/10ML IV SOLN
INTRAVENOUS | Status: DC | PRN
  Administered 2014-09-29: 4 mg via INTRAVENOUS

## 2014-09-29 SURGICAL SUPPLY — 30 items
BENZOIN TINCTURE PRP APPL 2/3 (GAUZE/BANDAGES/DRESSINGS) ×3 IMPLANT
BLADE SURG SZ10 CARB STEEL (BLADE) ×3 IMPLANT
CLOSURE WOUND 1/2 X4 (GAUZE/BANDAGES/DRESSINGS) ×1
DECANTER SPIKE VIAL GLASS SM (MISCELLANEOUS) IMPLANT
DRAIN PENROSE 18X1/2 LTX STRL (DRAIN) ×3 IMPLANT
DRAPE LAPAROTOMY TRNSV 102X78 (DRAPE) ×3 IMPLANT
DRAPE UTILITY XL STRL (DRAPES) ×3 IMPLANT
DRSG TEGADERM 4X4.75 (GAUZE/BANDAGES/DRESSINGS) ×3 IMPLANT
ELECT REM PT RETURN 9FT ADLT (ELECTROSURGICAL) ×3
ELECTRODE REM PT RTRN 9FT ADLT (ELECTROSURGICAL) ×1 IMPLANT
GAUZE SPONGE 4X4 12PLY STRL (GAUZE/BANDAGES/DRESSINGS) ×3 IMPLANT
GLOVE BIO SURGEON STRL SZ7.5 (GLOVE) ×3 IMPLANT
GLOVE BIOGEL M STRL SZ7.5 (GLOVE) IMPLANT
GLOVE INDICATOR 8.0 STRL GRN (GLOVE) ×3 IMPLANT
GOWN STRL REUS W/TWL LRG LVL3 (GOWN DISPOSABLE) ×3 IMPLANT
GOWN STRL REUS W/TWL XL LVL3 (GOWN DISPOSABLE) ×6 IMPLANT
KIT BASIN OR (CUSTOM PROCEDURE TRAY) ×3 IMPLANT
MARKER PEN SURG W/LABELS BLK (STERILIZATION PRODUCTS) ×3 IMPLANT
MESH ULTRAPRO 3X6 7.6X15CM (Mesh General) ×3 IMPLANT
NEEDLE HYPO 22GX1.5 SAFETY (NEEDLE) ×3 IMPLANT
NS IRRIG 1000ML POUR BTL (IV SOLUTION) ×3 IMPLANT
PACK GENERAL/GYN (CUSTOM PROCEDURE TRAY) ×3 IMPLANT
STRIP CLOSURE SKIN 1/2X4 (GAUZE/BANDAGES/DRESSINGS) ×2 IMPLANT
SUT MNCRL AB 4-0 PS2 18 (SUTURE) ×3 IMPLANT
SUT PROLENE 2 0 CT2 30 (SUTURE) ×9 IMPLANT
SUT VIC AB 2-0 SH 27 (SUTURE) ×2
SUT VIC AB 2-0 SH 27X BRD (SUTURE) ×1 IMPLANT
SUT VIC AB 3-0 SH 18 (SUTURE) ×3 IMPLANT
SYR CONTROL 10ML LL (SYRINGE) ×3 IMPLANT
TOWEL OR 17X26 10 PK STRL BLUE (TOWEL DISPOSABLE) ×3 IMPLANT

## 2014-09-29 NOTE — Discharge Instructions (Signed)
[facility]  UMBILICAL OR INGUINAL HERNIA REPAIR: POST OP INSTRUCTIONS  Always review your discharge instruction sheet given to you by the facility where your surgery was performed. IF YOU HAVE DISABILITY OR FAMILY LEAVE FORMS, YOU MUST BRING THEM TO THE OFFICE FOR PROCESSING.   DO NOT GIVE THEM TO YOUR DOCTOR.  1. A  prescription for pain medication may be given to you upon discharge.  Take your pain medication as prescribed, if needed.  If narcotic pain medicine is not needed, then you may take acetaminophen (Tylenol) or ibuprofen (Advil) as needed. 2. Take your usually prescribed medications unless otherwise directed. 3. If you need a refill on your pain medication, please contact your pharmacy.  They will contact our office to request authorization. Prescriptions will not be filled after 5 pm or on week-ends. 4. You should follow a light diet the first 24 hours after arrival home, such as soup and crackers, etc.  Be sure to include lots of fluids daily.  Resume your normal diet the day after surgery. 5. Most patients will experience some swelling and bruising around the umbilicus or in the groin and scrotum.  Ice packs and reclining will help.  Swelling and bruising can take several days to resolve.  6. It is common to experience some constipation if taking pain medication after surgery.  Increasing fluid intake and taking a stool softener (such as Colace) will usually help or prevent this problem from occurring.  A mild laxative (Milk of Magnesia or Miralax) should be taken according to package directions if there are no bowel movements after 48 hours. 7. Unless discharge instructions indicate otherwise, you may remove your bandages 48 hours after surgery, and you may shower at that time.  You have steri-strips (small skin tapes) in place directly over the incision.  These strips should be left on the skin for 7-10 days. . 8. ACTIVITIES:  You may resume regular (light) daily  activities beginning the next day--such as daily self-care, walking, climbing stairs--gradually increasing activities as tolerated.  You may have sexual intercourse when it is comfortable.  Refrain from any heavy lifting or straining until approved by your doctor. a. You may drive when you are no longer taking prescription pain medication, you can comfortably wear a seatbelt, and you can safely maneuver your car and apply brakes. b. RETURN TO WORK: 1-2 weeks with restrictions 9. You should see your doctor in the office for a follow-up appointment approximately 2-3 weeks after your surgery.  Make sure that you call for this appointment within a day or two after you arrive home to insure a convenient appointment time. 10. OTHER INSTRUCTIONS: DO NOT LIFT, PUSH, OR PULL ANYTHING GREATER THAN 10 POUNDS FOR 6 WEEKS    WHEN TO CALL YOUR DOCTOR: 1. Fever over 101.0 2. Inability to urinate 3. Nausea and/or vomiting 4. Extreme swelling or bruising 5. Continued bleeding from incision. 6. Increased pain, redness, or drainage from the incision  The clinic staff is available to answer your questions during regular business hours.  Please dont hesitate to call and ask to speak to one of the nurses for clinical concerns.  If you have a medical emergency, go to the nearest emergency room or call 911.  A surgeon from [facility] is always on call at the hospital   [address] [place], [place], [place]  [address] ?  [address], [place], [place]   [id number] [phone] ? [phone] ? FAX (  [phone] Web site: [email or web address]  Post Anesthesia Home Care Instructions  Activity: Get plenty of rest for the remainder of the day. A responsible adult should stay with you for 24 hours following the procedure.  For the next 24 hours, DO NOT: -Drive a car -Paediatric nurse -Drink alcoholic beverages -Take any medication unless instructed by your physician -Make any  legal decisions or sign important papers.  Meals: Start with liquid foods such as gelatin or soup. Progress to regular foods as tolerated. Avoid greasy, spicy, heavy foods. If nausea and/or vomiting occur, drink only clear liquids until the nausea and/or vomiting subsides. Call your physician if vomiting continues.  Special Instructions/Symptoms: Your throat may feel dry or sore from the anesthesia or the breathing tube placed in your throat during surgery. If this causes discomfort, gargle with warm salt water. The discomfort should disappear within 24 hours.

## 2014-09-29 NOTE — Anesthesia Postprocedure Evaluation (Signed)
  Anesthesia Post-op Note  Patient: Dominic Oliver  Procedure(s) Performed: Procedure(s) (LRB): OPEN RIGHT IINGUINAL HERNIA REPAIR WITH MESH (Right) INSERTION OF MESH (Right)  Patient Location: PACU  Anesthesia Type: General  Level of Consciousness: awake and alert   Airway and Oxygen Therapy: Patient Spontanous Breathing  Post-op Pain: mild  Post-op Assessment: Post-op Vital signs reviewed, Patient's Cardiovascular Status Stable, Respiratory Function Stable, Patent Airway and No signs of Nausea or vomiting  Last Vitals:  Filed Vitals:   09/29/14 0930  BP: 116/63  Pulse: 88  Temp:   Resp: 16    Post-op Vital Signs: stable   Complications: No apparent anesthesia complications

## 2014-09-29 NOTE — Op Note (Signed)
Dominic Oliver [id number] Oct 10, 1951 09/29/2014  Open Right Indirect Inguinal Hernia Repair with Mesh Procedure Note  Indications: The patient presented with a history of a right, reducible hernia.  He has been having worsening inguinal pain with burning and stinging pain so his hernia surgery was moved up. Please see my note  Pre-operative Diagnosis: right reducible inguinal hernia  Post-operative Diagnosis: right indirect inguinal hernia, right external oblique linear tear  Surgeon: Gayland Curry   Assistants: Servando Snare, RNFA  Anesthesia: General endotracheal anesthesia  ASA Class: 2   Surgeon: [name]. [name], MD, FACS  Procedure Details  The patient was seen again in the Holding Room. The risks, benefits, complications, treatment options, and expected outcomes were discussed with the patient. The possibilities of reaction to medication, pulmonary aspiration, perforation of viscus, bleeding, recurrent infection, the need for additional procedures, and development of a complication requiring transfusion or further operation were discussed with the patient and/or family. The likelihood of success in repairing the hernia and returning the patient to their previous functional status is good.  There was concurrence with the proposed plan, and informed consent was obtained. The site of surgery was properly noted/marked. The patient was taken to the Operating Room, identified as Dominic Oliver, and the procedure verified as right inguinal hernia repair. A Time Out was held and the above information confirmed. IV antibiotic administered.   The patient was placed in the supine position and underwent induction of anesthesia. The lower abdomen and groin was prepped with Chloraprep and draped in the standard fashion, and 0.25% Marcaine with epinephrine was used to anesthetize the skin over the mid-portion of the inguinal canal. An oblique incision was made. Dissection was carried down through the  subcutaneous tissue with cautery to the external oblique fascia. The patient had a linear tear in his external oblique fascia.  We opened the external oblique fascia along the direction of its fibers to the external ring.  The spermatic cord was circumferentially dissected bluntly and retracted with a Penrose drain.  The floor of the inguinal canal was inspected and no direct defect was found.  We skeletonized the spermatic cord and isolated a indirect sac which was stripped from surrounding structures and reduced into abdominal cavity.  We used a 3 x 6 inch piece of Ultrapro mesh, which was cut into a keyhole shape.  This was secured with 2-0 Prolene, beginning at the pubic tubercle, running this along the shelving edge inferiorly. Superiorly, the mesh was secured to the internal oblique fascia with interrupted 2-0 Prolene sutures.  The tails of the mesh were sutured together behind the spermatic cord.  The mesh was tucked underneath the external oblique fascia laterally.  The external oblique fascia was reapproximated with 2-0 Vicryl. The pre-existing linear tear in the ext oblique fascia was repair with 3-0 vicryl.  3-0 Vicryl was used to close the subcutaneous tissues and 4-0 Monocryl was used to close the skin in subcuticular fashion.  Benzoin and steri-strips were used to seal the incision.  A clean dressing was applied.  The patient was then extubated and brought to the recovery room in stable condition.  All sponge, instrument, and needle counts were correct prior to closure and at the conclusion of the case.   Estimated Blood Loss: Minimal                 Complications: None; patient tolerated the procedure well.         Disposition: PACU - hemodynamically stable.  Condition: stable  [name]. [name], MD, FACS General, Bariatric, & Minimally Invasive Surgery [facility], [place]

## 2014-09-29 NOTE — H&P (View-Only) (Signed)
History of Present Illness Dominic Oliver M. Dominic Knoth MD; 09/13/2014 1:27 PM) Patient words: hernia.  The patient is a 63 year old male who presents with an inguinal hernia. The hernia(s) is/are located on the right side. Symptoms include inguinal pain. The pain is located in the right inguinal area. The pain radiates to the right inguinal area and right hemiscrotum. The patient describes the pain as sharp, burning and stinging. Onset was gradual 6 week(s) ago. The symptoms occur during the day. The episodes occur daily. The patient describes this as worsening. Symptoms are exacerbated by straining, lifting and bending. Symptoms are relieved by recumbency. Associated symptoms do not include nausea, vomiting, constipation, obstipation or fever. The patient is not currently being treated for this problem. The patient was previously evaluated by a primary physician. Past evaluation has included CT scan of the abdomen.  pt s/p open repair of LIH with mesh by me 12/16/2013 comes in c/o knot in his right groin for past 6 weeks. while lying down no symptoms. but as day progresses he develops severe burning, stinging, pain in right groin that radiates to inner thigh. 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 saw PCP and Dr Dominic Oliver.   Other Problems Dominic Oliver, [place]; 09/13/2014 12:36 PM) Arthritis Back Pain Gastroesophageal Reflux Disease Inguinal Hernia Prostate Cancer  Past Surgical History Dominic Oliver, [place]; 09/13/2014 12:36 PM) Knee Surgery Right. Open Inguinal Hernia Surgery Left. Prostate Surgery - Removal  Diagnostic Studies History Dominic Oliver, [place]; 09/13/2014 12:36 PM) Colonoscopy 1-5 years ago  Allergies Dominic Oliver, [place]; 09/13/2014 12:36 PM) No Known Drug Allergies09/16/2015  Medication History Dominic Oliver, [place]; 09/13/2014 12:38 PM) Amitriptyline HCl (50MG  Tablet, Oral) Active. Omeprazole (40MG  Capsule DR, Oral) Active. Aspirin EC  (81MG  Tablet DR, Oral) Active.  Social History Dominic Oliver, [place]; 09/13/2014 12:36 PM) Alcohol use Occasional alcohol use. Caffeine use Coffee. No drug use Tobacco use Former smoker.  Family History Dominic Oliver, [place]; 09/13/2014 12:36 PM) Arthritis Dominic Oliver, Dominic Oliver, Dominic Oliver. Breast Cancer Dominic Oliver. Cerebrovascular Accident Dominic Oliver. Colon Polyps Dominic Oliver. Hypertension Dominic Oliver. Melanoma Dominic Oliver. Prostate Cancer Dominic Oliver.  Review of Systems ([place]. Oliver CMA; 09/13/2014 12:36 PM) General Present- Fatigue. Not Present- Appetite Loss, Chills, Fever, Night Sweats, Weight Gain and Weight Loss. Skin Not Present- Change in Wart/Mole, Dryness, Hives, Jaundice, New Lesions, Non-Healing Wounds, Rash and Ulcer. HEENT Present- Hearing Loss, Ringing in the Ears, Seasonal Allergies and Wears glasses/contact lenses. Not Present- Earache, Hoarseness, Nose Bleed, Oral Ulcers, Sinus Pain, Sore Throat, Visual Disturbances and Yellow Eyes. Respiratory Not Present- Bloody sputum, Chronic Cough, Difficulty Breathing, Snoring and Wheezing. Breast Not Present- Breast Mass, Breast Pain, Nipple Discharge and Skin Changes. Cardiovascular Not Present- Chest Pain, Difficulty Breathing Lying Down, Leg Cramps, Palpitations, Rapid Heart Rate, Shortness of Breath and Swelling of Extremities. Gastrointestinal Present- Abdominal Pain. Not Present- Bloating, Bloody Stool, Change in Bowel Habits, Chronic diarrhea, Constipation, Difficulty Swallowing, Excessive gas, Gets full quickly at meals, Hemorrhoids, Indigestion, Nausea, Rectal Pain and Vomiting. Male Genitourinary Present- Impotence. Not Present- Blood in Urine, Change in Urinary Stream, Frequency, Nocturia, Painful Urination, Urgency and Urine Leakage. Musculoskeletal Present- Back Pain. Not Present- Joint Pain, Joint Stiffness, Muscle Pain, Muscle Weakness and Swelling of Extremities. Neurological Not Present- Decreased Memory, Fainting, Headaches,  Numbness, Seizures, Tingling, Tremor, Trouble walking and Weakness. Psychiatric Not Present- Anxiety, Bipolar, Change in Sleep Pattern, Depression, Fearful and Frequent crying. Endocrine Not Present- Cold Intolerance, Excessive Hunger, Hair Changes, Heat Intolerance,  Hot flashes and New Diabetes. Hematology Present- Easy Bruising. Not Present- Excessive bleeding, Gland problems, HIV and Persistent Infections.   Vitals Dominic Oliver CMA; 09/13/2014 12:36 PM) 09/13/2014 12:36 PM Weight: 155.25 lb Height: 72in Body Surface Area: 1.89 m Body Mass Index: 21.06 kg/m Pulse: 78 (Regular)  BP: 120/78 (Sitting, Right Arm, Standard)    Physical Exam Dominic Oliver M. Ameria Sanjurjo MD; 09/13/2014 1:30 PM) General Mental Status-Alert. General Appearance-Consistent with stated age. Hydration-Well hydrated. Voice-Normal.  Head and Neck Head-normocephalic, atraumatic with no lesions or palpable masses. Trachea-midline. Thyroid Gland Characteristics - normal size and consistency.  Eye Eyeball - Bilateral-Extraocular movements intact. Sclera/Conjunctiva - Bilateral-No scleral icterus.  Chest and Lung Exam Chest and lung exam reveals -quiet, even and easy respiratory effort with no use of accessory muscles, normal resonance, no flatness or dullness and on auscultation, normal breath sounds, no adventitious sounds and normal vocal resonance. Inspection Chest Wall - Normal. Back - normal.  Breast Breast - Left-Symmetric, Non Tender, No Biopsy scars, no Dimpling, No Inflammation, No Lumpectomy scars, No Mastectomy scars, No Peau d' Orange. Breast - Right-Symmetric, Non Tender, No Biopsy scars, no Dimpling, No Inflammation, No Lumpectomy scars, No Mastectomy scars, No Peau d' Orange. Breast Lump-No Palpable Breast Mass.  Cardiovascular Cardiovascular examination reveals -on palpation PMI is normal in location and amplitude, no palpable S3 or S4. Normal cardiac borders.,  normal heart sounds, regular rate and rhythm with no murmurs, carotid auscultation reveals no bruits and normal pedal pulses bilaterally.  Abdomen Inspection Skin - Scar - no surgical scars. Hernias - Diastasis recti - Present. Inguinal hernia - Right - Reducible. Incisional scars - Note: old L inguinal scar; no evidence of LIH; small tiny umbilical hernia (<2WU). Palpation/Percussion Palpation and Percussion of the abdomen reveal - Soft, Non Tender, No Rebound tenderness, No Rigidity (guarding) and No hepatosplenomegaly. Auscultation Auscultation of the abdomen reveals - Bowel sounds normal.  Rectal Anorectal Exam Internal - normal internal exam and normal sphincter tone. Proctoscopic exam-No Internal hemorrhoids.  Peripheral Vascular Upper Extremity Inspection - Bilateral - Normal - No Clubbing, No Cyanosis, No Edema, Pulses Intact. Palpation - Pulses bilaterally normal. Lower Extremity Palpation - Pulses bilaterally normal.  Neurologic Neurologic evaluation reveals -alert and oriented x 3 with no impairment of recent or remote memory. Mental Status-Normal.  Musculoskeletal Normal Exam - Left-Upper Extremity Strength Normal and Lower Extremity Strength Normal. Normal Exam - Right-Upper Extremity Strength Normal, Lower Extremity Weakness.  Lymphatic Head & Neck  General Head & Neck Lymphatics: Bilateral - Description - Normal. Femoral & Inguinal  Generalized Femoral & Inguinal Lymphatics: Bilateral - Description - Normal. Tenderness - Non Tender.    Assessment & Plan Dominic Oliver M. Cote Mayabb MD; 09/13/2014 1:31 PM) RIGHT INGUINAL HERNIA (550.90  K40.90) Impression: We discussed the etiology of inguinal hernias. We discussed the signs & symptoms of incarceration & strangulation. We discussed non-operative and operative management.  The patient has elected to proceed with OPEN REPAIR OF RIGHT INGUINAL HERNIA WITH MESH  I described the procedure in detail. The patient was  given educational material. We discussed the risks and benefits including but not limited to bleeding, infection, chronic inguinal pain, nerve entrapment, hernia recurrence, mesh complications, hematoma formation, urinary retention, injury to the testicles or the ovaries, numbness in the groin, blood clots, injury to the surrounding structures, and anesthesia risk. We also discussed the typical post operative recovery course, including no heavy lifting for 4-6 weeks. I explained that the likelihood of improvement of their symptoms is fair  to good. Current Plans  Schedule for Surgery Started Flomax 0.4MG , 1 (one) Capsule daily, #7, 7 days starting 09/13/2014, No Refill. Started OxyCODONE HCl 5MG , 1 (one) Tablet every four hours, as needed, #40, 09/13/2014, No Refill.

## 2014-09-29 NOTE — Interval H&P Note (Signed)
History and Physical Interval Note:  09/29/2014 7:23 AM  Dominic Oliver  has presented today for surgery, with the diagnosis of Right Inguinal Hernia  The various methods of treatment have been discussed with the patient and family. After consideration of risks, benefits and other options for treatment, the patient has consented to  Procedure(s): OPEN RIGHT IINGUINAL HERNIA REPAIR WITH MESH (Right) INSERTION OF MESH (Right) as a surgical intervention .  The patient's history has been reviewed, patient examined, no change in status, stable for surgery.  I have reviewed the patient's chart and labs.  Questions were answered to the patient's satisfaction.    [name]. [name], MD, [place], Bariatric, & Minimally Invasive Surgery [facility], [place]    [facility] M

## 2014-09-29 NOTE — Transfer of Care (Signed)
Immediate Anesthesia Transfer of Care Note  Patient: Dominic Oliver  Procedure(s) Performed: Procedure(s): OPEN RIGHT IINGUINAL HERNIA REPAIR WITH MESH (Right) INSERTION OF MESH (Right)  Patient Location: PACU  Anesthesia Type:General  Level of Consciousness: sedated  Airway & Oxygen Therapy: Patient Spontanous Breathing and Patient connected to face mask oxygen  Post-op Assessment: Report given to PACU RN and Post -op Vital signs reviewed and stable  Post vital signs: Reviewed and stable  Complications: No apparent anesthesia complications

## 2014-09-29 NOTE — Anesthesia Preprocedure Evaluation (Signed)
Anesthesia Evaluation  Patient identified by MRN, date of birth, ID band Patient awake    Reviewed: Allergy & Precautions, H&P , NPO status , Patient's Chart, lab work & pertinent test results  Airway Mallampati: II TM Distance: >3 FB Neck ROM: Full    Dental no notable dental hx.    Pulmonary neg pulmonary ROS, former smoker,  breath sounds clear to auscultation  Pulmonary exam normal       Cardiovascular negative cardio ROS  Rhythm:Regular Rate:Normal     Neuro/Psych negative neurological ROS  negative psych ROS   GI/Hepatic Neg liver ROS, GERD-  Medicated,  Endo/Other  negative endocrine ROS  Renal/GU negative Renal ROS  negative genitourinary   Musculoskeletal negative musculoskeletal ROS (+)   Abdominal   Peds negative pediatric ROS (+)  Hematology negative hematology ROS (+)   Anesthesia Other Findings   Reproductive/Obstetrics negative OB ROS                           Anesthesia Physical Anesthesia Plan  ASA: II  Anesthesia Plan: General   Post-op Pain Management:    Induction: Intravenous  Airway Management Planned: Oral ETT  Additional Equipment:   Intra-op Plan:   Post-operative Plan: Extubation in OR  Informed Consent: I have reviewed the patients History and Physical, chart, labs and discussed the procedure including the risks, benefits and alternatives for the proposed anesthesia with the patient or authorized representative who has indicated his/her understanding and acceptance.   Dental advisory given  Plan Discussed with: CRNA and Surgeon  Anesthesia Plan Comments:         Anesthesia Quick Evaluation

## 2014-10-02 ENCOUNTER — Encounter (HOSPITAL_COMMUNITY): Payer: Self-pay | Admitting: General Surgery

## 2014-10-02 NOTE — Addendum Note (Signed)
Addendum created 10/02/14 1637 by Lind Covert, CRNA   Modules edited: Anesthesia Medication Administration

## 2015-09-05 ENCOUNTER — Other Ambulatory Visit (HOSPITAL_COMMUNITY): Payer: Self-pay | Admitting: Family Medicine

## 2015-09-05 ENCOUNTER — Ambulatory Visit (HOSPITAL_COMMUNITY)
Admission: RE | Admit: 2015-09-05 | Discharge: 2015-09-05 | Disposition: A | Discharge status: Home / Self Care | Payer: Managed Care, Other (non HMO) | Source: Ambulatory Visit | Attending: Family Medicine | Admitting: Family Medicine

## 2015-09-05 DIAGNOSIS — M25551 Pain in right hip: Secondary | ICD-10-CM

## 2017-02-26 ENCOUNTER — Encounter: Payer: Self-pay | Admitting: Family Medicine

## 2017-02-26 ENCOUNTER — Ambulatory Visit (INDEPENDENT_AMBULATORY_CARE_PROVIDER_SITE_OTHER): Payer: BLUE CROSS/BLUE SHIELD | Admitting: Family Medicine

## 2017-02-26 VITALS — BP 110/72 | HR 72 | Temp 98.5°F | Resp 18 | Ht 71.0 in | Wt 155.0 lb

## 2017-02-26 DIAGNOSIS — J3089 Other allergic rhinitis: Secondary | ICD-10-CM | POA: Insufficient documentation

## 2017-02-26 DIAGNOSIS — K219 Gastro-esophageal reflux disease without esophagitis: Secondary | ICD-10-CM | POA: Diagnosis not present

## 2017-02-26 DIAGNOSIS — Z8546 Personal history of malignant neoplasm of prostate: Secondary | ICD-10-CM | POA: Insufficient documentation

## 2017-02-26 DIAGNOSIS — R768 Other specified abnormal immunological findings in serum: Secondary | ICD-10-CM

## 2017-02-26 DIAGNOSIS — G2581 Restless legs syndrome: Secondary | ICD-10-CM

## 2017-02-26 DIAGNOSIS — Z7689 Persons encountering health services in other specified circumstances: Secondary | ICD-10-CM | POA: Diagnosis not present

## 2017-02-26 LAB — COMPREHENSIVE METABOLIC PANEL
ALK PHOS: 34 U/L — AB (ref 40–115)
ALT: 16 U/L (ref 9–46)
AST: 23 U/L (ref 10–35)
Albumin: 4.2 g/dL (ref 3.6–5.1)
BUN: 13 mg/dL (ref 7–25)
CALCIUM: 9.3 mg/dL (ref 8.6–10.3)
CHLORIDE: 106 mmol/L (ref 98–110)
CO2: 29 mmol/L (ref 20–31)
Creat: 1.08 mg/dL (ref 0.70–1.25)
Glucose, Bld: 94 mg/dL (ref 65–99)
POTASSIUM: 4.7 mmol/L (ref 3.5–5.3)
Sodium: 141 mmol/L (ref 135–146)
Total Bilirubin: 1.4 mg/dL — ABNORMAL HIGH (ref 0.2–1.2)
Total Protein: 6.7 g/dL (ref 6.1–8.1)

## 2017-02-26 LAB — URINALYSIS, ROUTINE W REFLEX MICROSCOPIC
BILIRUBIN URINE: NEGATIVE
Glucose, UA: NEGATIVE
HGB URINE DIPSTICK: NEGATIVE
KETONES UR: NEGATIVE
Leukocytes, UA: NEGATIVE
NITRITE: NEGATIVE
Protein, ur: NEGATIVE
SPECIFIC GRAVITY, URINE: 1.016 (ref 1.001–1.035)
pH: 6 (ref 5.0–8.0)

## 2017-02-26 LAB — LIPID PANEL
CHOL/HDL RATIO: 3.2 ratio (ref <5.0)
Cholesterol: 239 mg/dL — ABNORMAL HIGH (ref <200)
HDL: 75 mg/dL (ref >40)
LDL Cholesterol: 148 mg/dL — ABNORMAL HIGH (ref <100)
Triglycerides: 78 mg/dL (ref <150)
VLDL: 16 mg/dL (ref <30)

## 2017-02-26 LAB — CBC
HEMATOCRIT: 43.2 % (ref 38.5–50.0)
Hemoglobin: 14.7 g/dL (ref 13.2–17.1)
MCH: 31.9 pg (ref 27.0–33.0)
MCHC: 34 g/dL (ref 32.0–36.0)
MCV: 93.7 fL (ref 80.0–100.0)
MPV: 9.9 fL (ref 7.5–12.5)
PLATELETS: 279 10*3/uL (ref 140–400)
RBC: 4.61 MIL/uL (ref 4.20–5.80)
RDW: 13.2 % (ref 11.0–15.0)
WBC: 4 10*3/uL (ref 3.8–10.8)

## 2017-02-26 LAB — HEPATITIS B SURFACE ANTIBODY,QUALITATIVE: Hep B S Ab: NEGATIVE

## 2017-02-26 LAB — HEPATITIS B CORE ANTIBODY, IGM: HEP B C IGM: NONREACTIVE

## 2017-02-26 LAB — VITAMIN D 25 HYDROXY (VIT D DEFICIENCY, FRACTURES): Vit D, 25-Hydroxy: 26 ng/mL — ABNORMAL LOW (ref 30–100)

## 2017-02-26 LAB — HEPATITIS C ANTIBODY: HCV Ab: NEGATIVE

## 2017-02-26 LAB — HEPATITIS B SURFACE ANTIGEN: Hepatitis B Surface Ag: NEGATIVE

## 2017-02-26 NOTE — Patient Instructions (Signed)
Taper and discontinue the amitriptyline  Get the lab testing recommended  See me in 3-4 weeks for complete physical  Need old records

## 2017-02-26 NOTE — Progress Notes (Signed)
Chief Complaint  Patient presents with  . Establish Care   New to establish Last PE a year ago No ongoing health problems Was prescribed amitriptyline over 20 years ago for RLS, and although it has worked, he is slowly trying to taper off of med.  So far is down to a third of a pill. He has not noticed any difficulty sleeping or any increase in his restless legs He eats well. He exercises regularly. He is lean. He states his blood work is always normal. Never had diabetes hypertension or heart problems. He does have a history of prostate cancer. He had a prostatectomy performed. He does not have any urinary symptoms although this did leave him impotent. He has allergies, uses Flonase when necessary. He gives blood regularly. He is given many gallons in his lifetime. When he last donated, in January, he received a letter that states he is hepatitis B positive. This needs follow-up. His last blood donation a year ago was accepted. He has had no change in his health status over the last year, no surgery or procedure, no new sexual partners or exposure to blood-borne pathogen. He has had no gastrointestinal illness, hepatitis, or liver disease in the past. Colonoscopy is up-to-date. Immunizations are up-to-date. Old records will be requested.    Patient Active Problem List   Diagnosis Date Noted  . RLS (restless legs syndrome) 02/26/2017  . GERD (gastroesophageal reflux disease) 02/26/2017  . Environmental and seasonal allergies 02/26/2017  . Personal history of prostate cancer 02/26/2017  . Umbilical hernia [id number]    Outpatient Encounter Prescriptions as of 02/26/2017  Medication Sig  . amitriptyline (ELAVIL) 50 MG tablet Take 50 mg by mouth at bedtime. Taking 1/3 tablet  . ibuprofen (ADVIL,MOTRIN) 200 MG tablet Take 400 mg by mouth once as needed for headache or mild pain.  Marland Kitchen KRILL OIL PO Take by mouth.  . Triamcinolone Acetonide (NASACORT AQ NA) Place 1 spray into the nose daily  as needed (allergies (fall and spring)).   No facility-administered encounter medications on file as of 02/26/2017.     Past Medical History:  Diagnosis Date  . Arthritis    knees, spine, thumbs  . Diverticulitis   . GERD (gastroesophageal reflux disease)   . Peptic ulcer disease   . Prostate cancer [facility])    prostate  . Restless leg syndrome    amitripytline helps    Past Surgical History:  Procedure Laterality Date  . HERNIA REPAIR    . INGUINAL HERNIA REPAIR Right 09/29/2014   Procedure: OPEN RIGHT IINGUINAL HERNIA REPAIR WITH MESH;  Surgeon: Gayland Curry, MD;  Location: [facility];  Service: General;  Laterality: Right;  . INSERTION OF MESH Right 09/29/2014   Procedure: INSERTION OF MESH;  Surgeon: Gayland Curry, MD;  Location: [facility];  Service: General;  Laterality: Right;  . KNEE ARTHROSCOPY  10/2003  . LYMPHADENECTOMY Bilateral 03/18/2013   Procedure: LYMPHADENECTOMY;  Surgeon: Alexis Frock, MD;  Location: [facility];  Service: Urology;  Laterality: Bilateral;  . NASAL POLYP EXCISION  1989  . ROBOT ASSISTED LAPAROSCOPIC RADICAL PROSTATECTOMY N/A 03/18/2013   Procedure: ROBOTIC ASSISTED LAPAROSCOPIC RADICAL PROSTATECTOMY;  Surgeon: Alexis Frock, MD;  Location: [facility];  Service: Urology;  Laterality: N/A;    Social History   Social History  . Marital status: Married    Spouse name: Clinical cytogeneticist  . Number of children: 2  . Years of education: 13   Occupational History  . retired  Social History Main Topics  . Smoking status: Former Smoker    Packs/day: 0.25    Years: 30.00    Quit date: 09/29/1999  . Smokeless tobacco: Never Used     Comment: quit smoking oct 2000  . Alcohol use Yes     Comment: rare beer  . Drug use: No  . Sexual activity: Yes   Other Topics Concern  . Not on file   Social History Narrative   Lives at home with wife Rebeccah   Grandson stays frequently      Likes to walk, many projects       Family History  Problem Relation Age of Onset  .  Colon polyps Father   . Arthritis Father   . Cancer Father     skin, prostate  . Hearing loss Father   . Hypertension Father   . Stroke Father   . Cancer Mother     lung  . Heart disease Paternal Uncle   . Heart disease Paternal Uncle   . Cancer Paternal Uncle     Review of Systems  Constitutional: Negative for chills, fever and weight loss.  HENT: Negative for congestion and hearing loss.   Eyes: Negative for blurred vision and pain.  Respiratory: Negative for cough and shortness of breath.   Cardiovascular: Negative for chest pain and leg swelling.  Gastrointestinal: Negative for abdominal pain, constipation, diarrhea and heartburn.  Genitourinary: Negative for dysuria and frequency.       Impotent  Musculoskeletal: Negative for falls, joint pain and myalgias.  Neurological: Negative for dizziness, seizures and headaches.  Psychiatric/Behavioral: Negative for depression. The patient is not nervous/anxious and does not have insomnia.     BP 110/72 (BP Location: Right Arm, Patient Position: Sitting, Cuff Size: Normal)   Pulse 72   Temp 98.5 F (36.9 C) (Temporal)   Resp 18   Ht 5\' 11"  (1.803 m)   Wt 155 lb 0.3 oz (70.3 kg)   SpO2 97%   BMI 21.62 kg/m   Physical Exam  Constitutional: He is oriented to person, place, and time. He appears well-developed and well-nourished.  HENT:  Head: Normocephalic and atraumatic.  Mouth/Throat: Oropharynx is clear and moist.  Eyes: Conjunctivae are normal. Pupils are equal, round, and reactive to light.  Neck: Normal range of motion. Neck supple. No thyromegaly present.  Cardiovascular: Normal rate, regular rhythm and normal heart sounds.   Pulmonary/Chest: Effort normal and breath sounds normal. No respiratory distress.  Abdominal: Soft. Bowel sounds are normal.  No hepatomegaly  Musculoskeletal: Normal range of motion. He exhibits no edema.  Lymphadenopathy:    He has no cervical adenopathy.  Neurological: He is alert and  oriented to person, place, and time.  Gait normal  Skin: Skin is warm and dry.  Psychiatric: He has a normal mood and affect. His behavior is normal. Thought content normal.  Nursing note and vitals reviewed.  ASSESSMENT/PLAN:   1. RLS (restless legs syndrome) Patient is tapering his own amitriptyline  2. Gastroesophageal reflux disease, esophagitis presence not specified History in past, not currently medicated  3. Environmental and seasonal allergies Flonase when necessary  4. Positive hepatitis test  - Hepatitis B Core Antibody, IgM - Hepatitis C antibody - Hepatitis B surface antigen - Hepatitis B E Antibody - Hepatitis B Surface AntiBODY  5. Encounter to establish care with new doctor  - CBC - Comprehensive metabolic panel - Lipid panel - Urinalysis, Routine w reflex microscopic - VITAMIN D 25 Hydroxy (  Vit-D Deficiency, Fractures)   Patient Instructions  Taper and discontinue the amitriptyline  Get the lab testing recommended  See me in 3-4 weeks for complete physical  Need old records   Raylene Everts, MD

## 2017-03-02 ENCOUNTER — Encounter: Payer: Self-pay | Admitting: Family Medicine

## 2017-03-02 LAB — HEPATITIS B E ANTIBODY: HEPATITIS BE ANTIBODY: NONREACTIVE

## 2017-03-26 ENCOUNTER — Encounter: Payer: Self-pay | Admitting: Family Medicine

## 2017-03-26 ENCOUNTER — Encounter (INDEPENDENT_AMBULATORY_CARE_PROVIDER_SITE_OTHER): Payer: Self-pay

## 2017-03-26 ENCOUNTER — Ambulatory Visit (INDEPENDENT_AMBULATORY_CARE_PROVIDER_SITE_OTHER): Payer: BLUE CROSS/BLUE SHIELD | Admitting: Family Medicine

## 2017-03-26 VITALS — BP 126/66 | HR 88 | Temp 97.3°F | Resp 16 | Ht 71.0 in | Wt 153.0 lb

## 2017-03-26 DIAGNOSIS — Z0000 Encounter for general adult medical examination without abnormal findings: Secondary | ICD-10-CM

## 2017-03-26 DIAGNOSIS — E785 Hyperlipidemia, unspecified: Secondary | ICD-10-CM | POA: Diagnosis not present

## 2017-03-26 HISTORY — DX: Hyperlipidemia, unspecified: E78.5

## 2017-03-26 NOTE — Progress Notes (Signed)
Chief Complaint  Patient presents with  . Annual Exam   Recent labs reviewed No evidence hepatitis Hyperlipidemia discussed.  [place] risk of heart disease int he next 10 y is 13%. He chooses not to go on a statin.  Diet, exercise, supplements discussed Sees eye doc every 2 y Sees dentist every 6 mo Wears hearing aids No other specialty appts  Patient Active Problem List   Diagnosis Date Noted  . HLD (hyperlipidemia) 03/26/2017  . RLS (restless legs syndrome) 02/26/2017  . GERD (gastroesophageal reflux disease) 02/26/2017  . Environmental and seasonal allergies 02/26/2017  . Personal history of prostate cancer 02/26/2017  . Umbilical hernia 69/67/8938    Outpatient Encounter Prescriptions as of 03/26/2017  Medication Sig  . amitriptyline (ELAVIL) 50 MG tablet Take 50 mg by mouth at bedtime. Taking 1/3 tablet  . ibuprofen (ADVIL,MOTRIN) 200 MG tablet Take 400 mg by mouth once as needed for headache or mild pain.  Marland Kitchen KRILL OIL PO Take by mouth.   No facility-administered encounter medications on file as of 03/26/2017.     No Known Allergies  Review of Systems  Constitutional: Negative for activity change, appetite change, fatigue and unexpected weight change.  HENT: Positive for hearing loss. Negative for congestion and dental problem.   Eyes: Negative.  Negative for visual disturbance.       Glasses  Respiratory: Negative for cough and shortness of breath.   Cardiovascular: Negative for chest pain, palpitations and leg swelling.  Gastrointestinal: Negative for blood in stool, constipation and diarrhea.  Genitourinary: Negative for difficulty urinating and frequency.  Musculoskeletal: Negative for arthralgias and back pain.  Skin: Negative for rash.  Neurological: Negative for dizziness and facial asymmetry.  Psychiatric/Behavioral: Negative for dysphoric mood. The patient is not nervous/anxious.        "worrier"    BP 126/66 (BP Location: Right Arm, Patient  Position: Sitting, Cuff Size: Normal)   Pulse 88   Temp 97.3 F (36.3 C) (Temporal)   Resp 16   Ht 5\' 11"  (1.803 m)   Wt 153 lb (69.4 kg)   SpO2 99%   BMI 21.34 kg/m   Physical Exam BP 126/66 (BP Location: Right Arm, Patient Position: Sitting, Cuff Size: Normal)   Pulse 88   Temp 97.3 F (36.3 C) (Temporal)   Resp 16   Ht 5\' 11"  (1.803 m)   Wt 153 lb (69.4 kg)   SpO2 99%   BMI 21.34 kg/m   General Appearance:    Alert, cooperative, no distress, appears stated age  Head:    Normocephalic, without obvious abnormality, atraumatic  Eyes:    PERRL, conjunctiva/corneas clear, EOM's intact, fundi    benign, both eyes       Ears:    Normal TM's and external ear canals, both ears  Nose:   Nares normal, septum midline, mucosa normal, no drainage   or sinus tenderness  Throat:   Lips, mucosa, and tongue normal; teeth and gums normal  Neck:   Supple, symmetrical, trachea midline, no adenopathy;       thyroid:  No enlargement/tenderness/nodules; no carotid   bruit or JVD  Back:     Symmetric, no curvature, ROM normal, no CVA tenderness  Lungs:     Clear to auscultation bilaterally, respirations unlabored  Chest wall:    No tenderness or deformity  Heart:    Regular rate and rhythm, S1 and S2 normal, no murmur, rub   or gallop  Abdomen:  Soft, non-tender, bowel sounds active all four quadrants,    no masses, no organomegaly.  Small umb hernia  Genitalia:    Normal male without lesion, discharge or tenderness. Hernia scars  Extremities:   Extremities normal, atraumatic, no cyanosis or edema  Pulses:   2+ and symmetric all extremities  Skin:   Skin color, texture, turgor normal, no rashes or lesions.  SK on back disucssed  Lymph nodes:   Cervical, supraclavicular, and axillary nodes normal  Neurologic:   Normal strength, sensation and reflexes      throughout    ASSESSMENT/PLAN:  1. Annual physical exam normal  2. Hyperlipidemia, unspecified hyperlipidemia  type Diet/exercise   Patient Instructions  Continue to eat well and get daily exercise Watch the cholesterol in your diet Double your fish oil Add red yeast rice  See me for yearly PE     Raylene Everts, MD

## 2017-03-26 NOTE — Patient Instructions (Signed)
Continue to eat well and get daily exercise Watch the cholesterol in your diet Double your fish oil Add red yeast rice  See me for yearly PE

## 2017-05-28 ENCOUNTER — Telehealth: Payer: Self-pay | Admitting: Family Medicine

## 2017-05-28 ENCOUNTER — Ambulatory Visit (INDEPENDENT_AMBULATORY_CARE_PROVIDER_SITE_OTHER): Payer: BLUE CROSS/BLUE SHIELD | Admitting: Family Medicine

## 2017-05-28 ENCOUNTER — Encounter: Payer: Self-pay | Admitting: Family Medicine

## 2017-05-28 VITALS — BP 118/72 | HR 72 | Temp 97.7°F | Resp 16 | Ht 71.0 in | Wt 147.0 lb

## 2017-05-28 DIAGNOSIS — S29012A Strain of muscle and tendon of back wall of thorax, initial encounter: Secondary | ICD-10-CM | POA: Diagnosis not present

## 2017-05-28 DIAGNOSIS — M419 Scoliosis, unspecified: Secondary | ICD-10-CM

## 2017-05-28 MED ORDER — CYCLOBENZAPRINE HCL 5 MG PO TABS: 5.0000 mg | ORAL_TABLET | Freq: Three times a day (TID) | ORAL | 0 refills | Status: DC | PRN

## 2017-05-28 MED ORDER — IBUPROFEN 800 MG PO TABS: 800.0000 mg | ORAL_TABLET | Freq: Three times a day (TID) | ORAL | 0 refills | Status: DC | PRN

## 2017-05-28 NOTE — Telephone Encounter (Signed)
He told me he weaned himself off of amitriptyyline

## 2017-05-28 NOTE — Telephone Encounter (Signed)
Called Dominic Oliver, confirmed he is NOT taking the amitriptyline, called Dominic Oliver, at pharmacy aware.

## 2017-05-28 NOTE — Telephone Encounter (Signed)
Melissa w/[facility] calling in reference to Flexeril and Amitriptyline.  Possible drug interaction.  [place]?  cb  [phone]

## 2017-05-28 NOTE — Patient Instructions (Signed)
Ice for 20 min every evening Massage area Stretch area twice a day Take the ibuprofen 3 times a day with food for one week, then as needed Take the flexeril as needed, caution drowsiness This is good at night Call if no better in 2 weeks and I will order physical therapy

## 2017-05-28 NOTE — Progress Notes (Signed)
Chief Complaint  Patient presents with  . Shoulder Pain    left ~ 2 months   As above Never had before No new activity or use No trauma or injury Has tried OTC advil and rest It hurts at night No radiation into arm Feels tight , and sore and uncomfortable - not terribly painful  Patient Active Problem List   Diagnosis Date Noted  . Scoliosis deformity of spine 05/28/2017  . HLD (hyperlipidemia) 03/26/2017  . RLS (restless legs syndrome) 02/26/2017  . GERD (gastroesophageal reflux disease) 02/26/2017  . Environmental and seasonal allergies 02/26/2017  . Personal history of prostate cancer 02/26/2017  . Umbilical hernia 63/84/6659    Outpatient Encounter Prescriptions as of 05/28/2017  Medication Sig  . amitriptyline (ELAVIL) 50 MG tablet Take 50 mg by mouth at bedtime. Taking 1/3 tablet  . cholecalciferol (VITAMIN D) 1000 units tablet Take 2,000 Units by mouth daily.  . Cyanocobalamin (VITAMIN B 12 PO) Take by mouth.  Marland Kitchen ibuprofen (ADVIL,MOTRIN) 200 MG tablet Take 400 mg by mouth once as needed for headache or mild pain.  Marland Kitchen KRILL OIL PO Take by mouth.  . cyclobenzaprine (FLEXERIL) 5 MG tablet Take 1 tablet (5 mg total) by mouth 3 (three) times daily as needed for muscle spasms.  Marland Kitchen ibuprofen (ADVIL,MOTRIN) 800 MG tablet Take 1 tablet (800 mg total) by mouth every 8 (eight) hours as needed.   No facility-administered encounter medications on file as of 05/28/2017.     No Known Allergies  Review of Systems  Constitutional: Negative for activity change, appetite change and unexpected weight change.  HENT: Negative for congestion and dental problem.   Eyes: Negative for redness and visual disturbance.  Respiratory: Negative for cough, chest tightness and shortness of breath.   Cardiovascular: Negative for chest pain and palpitations.  Gastrointestinal: Negative for constipation and diarrhea.  Genitourinary: Negative for dysuria and flank pain.  Musculoskeletal: Positive  for back pain, neck pain and neck stiffness.  Skin: Negative.  Negative for rash.  Neurological: Negative.  Negative for headaches.  Psychiatric/Behavioral: Positive for sleep disturbance. The patient is not nervous/anxious.     BP 118/72 (BP Location: Right Arm, Patient Position: Sitting, Cuff Size: Normal)   Pulse 72   Temp 97.7 F (36.5 C) (Temporal)   Resp 16   Ht 5\' 11"  (1.803 m)   Wt 147 lb 0.6 oz (66.7 kg)   SpO2 99%   BMI 20.51 kg/m   Physical Exam  Constitutional: He is oriented to person, place, and time. He appears well-developed and well-nourished. No distress.  HENT:  Head: Normocephalic and atraumatic.  Mouth/Throat: Oropharynx is clear and moist.  Eyes: EOM are normal. Pupils are equal, round, and reactive to light.  Neck: Normal range of motion. Muscular tenderness present. No thyromegaly present.  Cardiovascular: Normal rate, regular rhythm and normal heart sounds.   Pulmonary/Chest: Effort normal.  Musculoskeletal: Normal range of motion.       Back:  Lymphadenopathy:    He has no cervical adenopathy.  Neurological: He is alert and oriented to person, place, and time. He displays normal reflexes. Coordination normal.  Psychiatric: He has a normal mood and affect. His behavior is normal.  Left shoulder with FROM and no impingement.  Mild crepitus.  ASSESSMENT/PLAN:  1. Strain of rhomboid muscle, initial encounter   2. Scoliosis of thoracolumbar spine, unspecified scoliosis type    Patient Instructions  Ice for 20 min every evening Massage area Stretch area twice a  day Take the ibuprofen 3 times a day with food for one week, then as needed Take the flexeril as needed, caution drowsiness This is good at night Call if no better in 2 weeks and I will order physical therapy   Raylene Everts, MD

## 2018-02-04 ENCOUNTER — Ambulatory Visit: Payer: Managed Care, Other (non HMO) | Admitting: Family Medicine

## 2018-02-04 ENCOUNTER — Encounter: Payer: Self-pay | Admitting: Family Medicine

## 2018-02-04 ENCOUNTER — Other Ambulatory Visit: Payer: Self-pay

## 2018-02-04 VITALS — BP 118/78 | HR 80 | Temp 97.8°F | Resp 18 | Ht 71.0 in | Wt 148.0 lb

## 2018-02-04 DIAGNOSIS — J069 Acute upper respiratory infection, unspecified: Secondary | ICD-10-CM | POA: Diagnosis not present

## 2018-02-04 NOTE — Patient Instructions (Signed)
Continue OTC med Consider saline nasal wash Call if not better by day 8-10

## 2018-02-04 NOTE — Progress Notes (Signed)
Chief Complaint  Patient presents with  . Sinusitis    x 4 days   Patient is here for a viral infection.  He has runny and stuffy nose, sinus pressure, mild sore throat, and cough for 4 days.  No fever or chills.  No purulent sputum.  Mild headache.  No ear pressure or pain.  No exposure to illness.  No chest pain or pressure, no wheezing.  No underlying allergies her ENT disease.  No smoking history COPD or asthma.  Patient Active Problem List   Diagnosis Date Noted  . Scoliosis deformity of spine 05/28/2017  . HLD (hyperlipidemia) 03/26/2017  . RLS (restless legs syndrome) 02/26/2017  . GERD (gastroesophageal reflux disease) 02/26/2017  . Environmental and seasonal allergies 02/26/2017  . Personal history of prostate cancer 02/26/2017  . Umbilical hernia 79/01/4096    Outpatient Encounter Medications as of 02/04/2018  Medication Sig  . cholecalciferol (VITAMIN D) 1000 units tablet Take 2,000 Units by mouth daily.  . Cyanocobalamin (VITAMIN B 12 PO) Take by mouth.  . cyclobenzaprine (FLEXERIL) 5 MG tablet Take 1 tablet (5 mg total) by mouth 3 (three) times daily as needed for muscle spasms.  Marland Kitchen ibuprofen (ADVIL,MOTRIN) 200 MG tablet Take 400 mg by mouth once as needed for headache or mild pain.  Marland Kitchen KRILL OIL PO Take by mouth.  . [DISCONTINUED] ibuprofen (ADVIL,MOTRIN) 800 MG tablet Take 1 tablet (800 mg total) by mouth every 8 (eight) hours as needed.   No facility-administered encounter medications on file as of 02/04/2018.     No Known Allergies  Review of Systems  Constitutional: Negative for activity change, appetite change, chills, fatigue and fever.  HENT: Positive for congestion, postnasal drip, rhinorrhea, sinus pressure and sore throat.   Eyes: Negative for redness and visual disturbance.  Respiratory: Positive for cough. Negative for shortness of breath and wheezing.   Cardiovascular: Negative for chest pain and palpitations.  Gastrointestinal: Negative for diarrhea,  nausea and vomiting.  Genitourinary: Negative for dysuria and frequency.  Musculoskeletal: Negative for arthralgias and myalgias.  Neurological: Negative for dizziness and headaches.    BP 118/78 (BP Location: Right Arm, Patient Position: Sitting, Cuff Size: Normal)   Pulse 80   Temp 97.8 F (36.6 C) (Temporal)   Resp 18   Ht 5\' 11"  (1.803 m)   Wt 148 lb 0.6 oz (67.2 kg)   SpO2 98%   BMI 20.65 kg/m   Physical Exam  Constitutional: He is oriented to person, place, and time. He appears well-developed and well-nourished.  HENT:  Head: Normocephalic and atraumatic.  Right Ear: External ear normal.  Left Ear: External ear normal.  Mouth/Throat: Oropharynx is clear and moist.  Mild posterior pharyngeal injection.  No sinus tenderness.  Clear rhinorrhea  Eyes: Conjunctivae are normal. Pupils are equal, round, and reactive to light.  Neck: Normal range of motion. Neck supple. No thyromegaly present.  Cardiovascular: Normal rate, regular rhythm and normal heart sounds.  Pulmonary/Chest: Effort normal and breath sounds normal. No respiratory distress.  Musculoskeletal: Normal range of motion. He exhibits no edema.  Lymphadenopathy:    He has no cervical adenopathy.  Neurological: He is alert and oriented to person, place, and time.  Gait normal  Skin: Skin is warm and dry.  Psychiatric: He has a normal mood and affect. His behavior is normal. Thought content normal.  Nursing note and vitals reviewed.   ASSESSMENT/PLAN:  1. Viral upper respiratory tract infection Discussed symptomatic care.  Need to avoid  antibiotics.   Patient Instructions  Continue OTC med Consider saline nasal wash Call if not better by day 8-10   Raylene Everts, MD

## 2018-03-26 ENCOUNTER — Encounter: Payer: BLUE CROSS/BLUE SHIELD | Admitting: Family Medicine

## 2018-06-03 ENCOUNTER — Encounter: Payer: Self-pay | Admitting: Family Medicine

## 2018-06-04 ENCOUNTER — Encounter: Payer: Self-pay | Admitting: Family Medicine

## 2018-11-11 DIAGNOSIS — H1045 Other chronic allergic conjunctivitis: Secondary | ICD-10-CM | POA: Diagnosis not present

## 2018-11-11 DIAGNOSIS — H52223 Regular astigmatism, bilateral: Secondary | ICD-10-CM | POA: Diagnosis not present

## 2018-11-11 DIAGNOSIS — H5213 Myopia, bilateral: Secondary | ICD-10-CM | POA: Diagnosis not present

## 2018-11-11 DIAGNOSIS — H2513 Age-related nuclear cataract, bilateral: Secondary | ICD-10-CM | POA: Diagnosis not present

## 2018-11-22 DIAGNOSIS — G4700 Insomnia, unspecified: Secondary | ICD-10-CM | POA: Diagnosis not present

## 2018-11-22 DIAGNOSIS — G3184 Mild cognitive impairment, so stated: Secondary | ICD-10-CM | POA: Diagnosis not present

## 2018-11-23 ENCOUNTER — Other Ambulatory Visit (HOSPITAL_COMMUNITY): Payer: Self-pay | Admitting: Internal Medicine

## 2018-11-23 DIAGNOSIS — G3184 Mild cognitive impairment, so stated: Secondary | ICD-10-CM

## 2018-12-10 ENCOUNTER — Ambulatory Visit (HOSPITAL_COMMUNITY)
Admission: RE | Admit: 2018-12-10 | Discharge: 2018-12-10 | Disposition: A | Discharge status: Home / Self Care | Payer: Medicare Other | Source: Ambulatory Visit | Attending: Internal Medicine | Admitting: Internal Medicine

## 2018-12-10 ENCOUNTER — Encounter (HOSPITAL_COMMUNITY): Payer: Self-pay

## 2018-12-10 DIAGNOSIS — G3184 Mild cognitive impairment, so stated: Secondary | ICD-10-CM | POA: Diagnosis not present

## 2018-12-10 DIAGNOSIS — R413 Other amnesia: Secondary | ICD-10-CM | POA: Diagnosis not present

## 2018-12-10 LAB — POCT I-STAT CREATININE: CREATININE: 1 mg/dL (ref 0.61–1.24)

## 2018-12-10 MED ORDER — IOHEXOL 300 MG/ML  SOLN
75.0000 mL | Freq: Once | INTRAMUSCULAR | Status: AC | PRN
  Administered 2018-12-10: 75 mL via INTRAVENOUS

## 2018-12-14 DIAGNOSIS — Z23 Encounter for immunization: Secondary | ICD-10-CM | POA: Diagnosis not present

## 2018-12-27 DIAGNOSIS — J060 Acute laryngopharyngitis: Secondary | ICD-10-CM | POA: Diagnosis not present

## 2018-12-27 DIAGNOSIS — J019 Acute sinusitis, unspecified: Secondary | ICD-10-CM | POA: Diagnosis not present

## 2019-01-03 DIAGNOSIS — G4700 Insomnia, unspecified: Secondary | ICD-10-CM | POA: Diagnosis not present

## 2019-01-03 DIAGNOSIS — C61 Malignant neoplasm of prostate: Secondary | ICD-10-CM | POA: Diagnosis not present

## 2019-01-03 DIAGNOSIS — R413 Other amnesia: Secondary | ICD-10-CM | POA: Diagnosis not present

## 2019-01-06 DIAGNOSIS — N393 Stress incontinence (female) (male): Secondary | ICD-10-CM | POA: Diagnosis not present

## 2019-01-06 DIAGNOSIS — C61 Malignant neoplasm of prostate: Secondary | ICD-10-CM | POA: Diagnosis not present

## 2019-01-06 DIAGNOSIS — N5201 Erectile dysfunction due to arterial insufficiency: Secondary | ICD-10-CM | POA: Diagnosis not present

## 2019-01-24 DIAGNOSIS — M9902 Segmental and somatic dysfunction of thoracic region: Secondary | ICD-10-CM | POA: Diagnosis not present

## 2019-01-24 DIAGNOSIS — M542 Cervicalgia: Secondary | ICD-10-CM | POA: Diagnosis not present

## 2019-01-24 DIAGNOSIS — M546 Pain in thoracic spine: Secondary | ICD-10-CM | POA: Diagnosis not present

## 2019-01-24 DIAGNOSIS — Z6820 Body mass index (BMI) 20.0-20.9, adult: Secondary | ICD-10-CM | POA: Diagnosis not present

## 2019-01-24 DIAGNOSIS — M9901 Segmental and somatic dysfunction of cervical region: Secondary | ICD-10-CM | POA: Diagnosis not present

## 2019-01-24 DIAGNOSIS — R5383 Other fatigue: Secondary | ICD-10-CM | POA: Diagnosis not present

## 2019-01-24 DIAGNOSIS — E782 Mixed hyperlipidemia: Secondary | ICD-10-CM | POA: Diagnosis not present

## 2019-01-28 DIAGNOSIS — M542 Cervicalgia: Secondary | ICD-10-CM | POA: Diagnosis not present

## 2019-01-28 DIAGNOSIS — M9901 Segmental and somatic dysfunction of cervical region: Secondary | ICD-10-CM | POA: Diagnosis not present

## 2019-01-28 DIAGNOSIS — M546 Pain in thoracic spine: Secondary | ICD-10-CM | POA: Diagnosis not present

## 2019-01-28 DIAGNOSIS — M9903 Segmental and somatic dysfunction of lumbar region: Secondary | ICD-10-CM | POA: Diagnosis not present

## 2019-01-28 DIAGNOSIS — M9902 Segmental and somatic dysfunction of thoracic region: Secondary | ICD-10-CM | POA: Diagnosis not present

## 2019-01-31 DIAGNOSIS — M9903 Segmental and somatic dysfunction of lumbar region: Secondary | ICD-10-CM | POA: Diagnosis not present

## 2019-01-31 DIAGNOSIS — G4700 Insomnia, unspecified: Secondary | ICD-10-CM | POA: Diagnosis not present

## 2019-01-31 DIAGNOSIS — Z8719 Personal history of other diseases of the digestive system: Secondary | ICD-10-CM | POA: Diagnosis not present

## 2019-01-31 DIAGNOSIS — M9901 Segmental and somatic dysfunction of cervical region: Secondary | ICD-10-CM | POA: Diagnosis not present

## 2019-01-31 DIAGNOSIS — Z0000 Encounter for general adult medical examination without abnormal findings: Secondary | ICD-10-CM | POA: Diagnosis not present

## 2019-01-31 DIAGNOSIS — M542 Cervicalgia: Secondary | ICD-10-CM | POA: Diagnosis not present

## 2019-01-31 DIAGNOSIS — M546 Pain in thoracic spine: Secondary | ICD-10-CM | POA: Diagnosis not present

## 2019-01-31 DIAGNOSIS — J019 Acute sinusitis, unspecified: Secondary | ICD-10-CM | POA: Diagnosis not present

## 2019-01-31 DIAGNOSIS — Z8546 Personal history of malignant neoplasm of prostate: Secondary | ICD-10-CM | POA: Diagnosis not present

## 2019-01-31 DIAGNOSIS — G3184 Mild cognitive impairment, so stated: Secondary | ICD-10-CM | POA: Diagnosis not present

## 2019-01-31 DIAGNOSIS — M9902 Segmental and somatic dysfunction of thoracic region: Secondary | ICD-10-CM | POA: Diagnosis not present

## 2019-01-31 DIAGNOSIS — E782 Mixed hyperlipidemia: Secondary | ICD-10-CM | POA: Diagnosis not present

## 2019-01-31 DIAGNOSIS — H9193 Unspecified hearing loss, bilateral: Secondary | ICD-10-CM | POA: Diagnosis not present

## 2019-02-02 DIAGNOSIS — M542 Cervicalgia: Secondary | ICD-10-CM | POA: Diagnosis not present

## 2019-02-02 DIAGNOSIS — M9901 Segmental and somatic dysfunction of cervical region: Secondary | ICD-10-CM | POA: Diagnosis not present

## 2019-02-02 DIAGNOSIS — M546 Pain in thoracic spine: Secondary | ICD-10-CM | POA: Diagnosis not present

## 2019-02-02 DIAGNOSIS — M9902 Segmental and somatic dysfunction of thoracic region: Secondary | ICD-10-CM | POA: Diagnosis not present

## 2019-02-02 DIAGNOSIS — M9903 Segmental and somatic dysfunction of lumbar region: Secondary | ICD-10-CM | POA: Diagnosis not present

## 2019-02-09 DIAGNOSIS — M9903 Segmental and somatic dysfunction of lumbar region: Secondary | ICD-10-CM | POA: Diagnosis not present

## 2019-02-09 DIAGNOSIS — M546 Pain in thoracic spine: Secondary | ICD-10-CM | POA: Diagnosis not present

## 2019-02-09 DIAGNOSIS — M542 Cervicalgia: Secondary | ICD-10-CM | POA: Diagnosis not present

## 2019-02-09 DIAGNOSIS — M9901 Segmental and somatic dysfunction of cervical region: Secondary | ICD-10-CM | POA: Diagnosis not present

## 2019-02-09 DIAGNOSIS — M9902 Segmental and somatic dysfunction of thoracic region: Secondary | ICD-10-CM | POA: Diagnosis not present

## 2019-05-12 DIAGNOSIS — F5101 Primary insomnia: Secondary | ICD-10-CM | POA: Diagnosis not present

## 2019-05-30 DIAGNOSIS — M7712 Lateral epicondylitis, left elbow: Secondary | ICD-10-CM | POA: Diagnosis not present

## 2019-05-30 DIAGNOSIS — M25522 Pain in left elbow: Secondary | ICD-10-CM | POA: Diagnosis not present

## 2019-07-14 DIAGNOSIS — M19049 Primary osteoarthritis, unspecified hand: Secondary | ICD-10-CM | POA: Diagnosis not present

## 2019-07-14 DIAGNOSIS — M79643 Pain in unspecified hand: Secondary | ICD-10-CM | POA: Diagnosis not present

## 2019-08-01 DIAGNOSIS — E782 Mixed hyperlipidemia: Secondary | ICD-10-CM | POA: Diagnosis not present

## 2019-08-04 DIAGNOSIS — H9193 Unspecified hearing loss, bilateral: Secondary | ICD-10-CM | POA: Diagnosis not present

## 2019-08-04 DIAGNOSIS — E782 Mixed hyperlipidemia: Secondary | ICD-10-CM | POA: Diagnosis not present

## 2019-08-04 DIAGNOSIS — Z8546 Personal history of malignant neoplasm of prostate: Secondary | ICD-10-CM | POA: Diagnosis not present

## 2019-08-04 DIAGNOSIS — G4700 Insomnia, unspecified: Secondary | ICD-10-CM | POA: Diagnosis not present

## 2019-08-04 DIAGNOSIS — G3184 Mild cognitive impairment, so stated: Secondary | ICD-10-CM | POA: Diagnosis not present

## 2019-08-04 DIAGNOSIS — Z8719 Personal history of other diseases of the digestive system: Secondary | ICD-10-CM | POA: Diagnosis not present

## 2019-08-15 DIAGNOSIS — L239 Allergic contact dermatitis, unspecified cause: Secondary | ICD-10-CM | POA: Diagnosis not present

## 2019-10-05 DIAGNOSIS — Z23 Encounter for immunization: Secondary | ICD-10-CM | POA: Diagnosis not present

## 2019-11-10 DIAGNOSIS — H524 Presbyopia: Secondary | ICD-10-CM | POA: Diagnosis not present

## 2019-11-10 DIAGNOSIS — H2513 Age-related nuclear cataract, bilateral: Secondary | ICD-10-CM | POA: Diagnosis not present

## 2019-11-10 DIAGNOSIS — H52223 Regular astigmatism, bilateral: Secondary | ICD-10-CM | POA: Diagnosis not present

## 2019-11-10 DIAGNOSIS — H25813 Combined forms of age-related cataract, bilateral: Secondary | ICD-10-CM | POA: Diagnosis not present

## 2019-11-10 DIAGNOSIS — H5213 Myopia, bilateral: Secondary | ICD-10-CM | POA: Diagnosis not present

## 2020-02-09 DIAGNOSIS — M6588 Other synovitis and tenosynovitis, other site: Secondary | ICD-10-CM | POA: Diagnosis not present

## 2020-02-09 DIAGNOSIS — R413 Other amnesia: Secondary | ICD-10-CM | POA: Diagnosis not present

## 2020-02-09 DIAGNOSIS — Z23 Encounter for immunization: Secondary | ICD-10-CM | POA: Diagnosis not present

## 2020-02-09 DIAGNOSIS — M7712 Lateral epicondylitis, left elbow: Secondary | ICD-10-CM | POA: Diagnosis not present

## 2020-02-09 DIAGNOSIS — H9193 Unspecified hearing loss, bilateral: Secondary | ICD-10-CM | POA: Diagnosis not present

## 2020-02-09 DIAGNOSIS — M1611 Unilateral primary osteoarthritis, right hip: Secondary | ICD-10-CM | POA: Diagnosis not present

## 2020-02-09 DIAGNOSIS — Z8546 Personal history of malignant neoplasm of prostate: Secondary | ICD-10-CM | POA: Diagnosis not present

## 2020-02-09 DIAGNOSIS — E782 Mixed hyperlipidemia: Secondary | ICD-10-CM | POA: Diagnosis not present

## 2020-02-09 DIAGNOSIS — F5101 Primary insomnia: Secondary | ICD-10-CM | POA: Diagnosis not present

## 2020-02-09 DIAGNOSIS — M79643 Pain in unspecified hand: Secondary | ICD-10-CM | POA: Diagnosis not present

## 2020-02-09 DIAGNOSIS — Z8719 Personal history of other diseases of the digestive system: Secondary | ICD-10-CM | POA: Diagnosis not present

## 2020-02-09 DIAGNOSIS — Z0000 Encounter for general adult medical examination without abnormal findings: Secondary | ICD-10-CM | POA: Diagnosis not present

## 2020-02-09 DIAGNOSIS — M19049 Primary osteoarthritis, unspecified hand: Secondary | ICD-10-CM | POA: Diagnosis not present

## 2020-02-28 DIAGNOSIS — Z8546 Personal history of malignant neoplasm of prostate: Secondary | ICD-10-CM | POA: Diagnosis not present

## 2020-02-28 DIAGNOSIS — Z8719 Personal history of other diseases of the digestive system: Secondary | ICD-10-CM | POA: Diagnosis not present

## 2020-02-28 DIAGNOSIS — G3184 Mild cognitive impairment, so stated: Secondary | ICD-10-CM | POA: Diagnosis not present

## 2020-02-28 DIAGNOSIS — H9193 Unspecified hearing loss, bilateral: Secondary | ICD-10-CM | POA: Diagnosis not present

## 2020-02-28 DIAGNOSIS — G4700 Insomnia, unspecified: Secondary | ICD-10-CM | POA: Diagnosis not present

## 2020-02-28 DIAGNOSIS — E782 Mixed hyperlipidemia: Secondary | ICD-10-CM | POA: Diagnosis not present

## 2020-03-09 DIAGNOSIS — Z23 Encounter for immunization: Secondary | ICD-10-CM | POA: Diagnosis not present

## 2020-04-02 DIAGNOSIS — M25562 Pain in left knee: Secondary | ICD-10-CM | POA: Diagnosis not present

## 2020-04-26 DIAGNOSIS — M25562 Pain in left knee: Secondary | ICD-10-CM | POA: Diagnosis not present

## 2020-05-02 DIAGNOSIS — S83242D Other tear of medial meniscus, current injury, left knee, subsequent encounter: Secondary | ICD-10-CM | POA: Diagnosis not present

## 2020-05-02 DIAGNOSIS — M25562 Pain in left knee: Secondary | ICD-10-CM | POA: Diagnosis not present

## 2020-05-14 DIAGNOSIS — X58XXXA Exposure to other specified factors, initial encounter: Secondary | ICD-10-CM | POA: Diagnosis not present

## 2020-05-14 DIAGNOSIS — M94262 Chondromalacia, left knee: Secondary | ICD-10-CM | POA: Diagnosis not present

## 2020-05-14 DIAGNOSIS — S83232A Complex tear of medial meniscus, current injury, left knee, initial encounter: Secondary | ICD-10-CM | POA: Diagnosis not present

## 2020-05-14 DIAGNOSIS — Y999 Unspecified external cause status: Secondary | ICD-10-CM | POA: Diagnosis not present

## 2020-05-14 DIAGNOSIS — M2242 Chondromalacia patellae, left knee: Secondary | ICD-10-CM | POA: Diagnosis not present

## 2020-05-22 ENCOUNTER — Other Ambulatory Visit: Payer: Self-pay

## 2020-05-22 ENCOUNTER — Ambulatory Visit (HOSPITAL_COMMUNITY)
Admission: RE | Admit: 2020-05-22 | Discharge: 2020-05-22 | Disposition: A | Discharge status: Home / Self Care | Payer: Medicare Other | Source: Ambulatory Visit | Attending: Specialist | Admitting: Specialist

## 2020-05-22 ENCOUNTER — Other Ambulatory Visit: Payer: Self-pay | Admitting: Specialist

## 2020-05-22 ENCOUNTER — Other Ambulatory Visit (HOSPITAL_COMMUNITY): Payer: Self-pay | Admitting: Specialist

## 2020-05-22 DIAGNOSIS — M79605 Pain in left leg: Secondary | ICD-10-CM

## 2020-05-22 DIAGNOSIS — M79662 Pain in left lower leg: Secondary | ICD-10-CM

## 2020-05-29 DIAGNOSIS — Z4889 Encounter for other specified surgical aftercare: Secondary | ICD-10-CM | POA: Diagnosis not present

## 2020-05-29 DIAGNOSIS — M25562 Pain in left knee: Secondary | ICD-10-CM | POA: Diagnosis not present

## 2020-05-29 DIAGNOSIS — M79605 Pain in left leg: Secondary | ICD-10-CM | POA: Diagnosis not present

## 2020-06-02 DIAGNOSIS — M25562 Pain in left knee: Secondary | ICD-10-CM | POA: Diagnosis not present

## 2020-06-11 DIAGNOSIS — G4700 Insomnia, unspecified: Secondary | ICD-10-CM | POA: Diagnosis not present

## 2020-06-11 DIAGNOSIS — G3184 Mild cognitive impairment, so stated: Secondary | ICD-10-CM | POA: Diagnosis not present

## 2020-07-24 DIAGNOSIS — R070 Pain in throat: Secondary | ICD-10-CM | POA: Diagnosis not present

## 2020-07-24 DIAGNOSIS — R05 Cough: Secondary | ICD-10-CM | POA: Diagnosis not present

## 2020-07-24 DIAGNOSIS — J069 Acute upper respiratory infection, unspecified: Secondary | ICD-10-CM | POA: Diagnosis not present

## 2020-08-02 DIAGNOSIS — J069 Acute upper respiratory infection, unspecified: Secondary | ICD-10-CM | POA: Diagnosis not present

## 2020-08-02 DIAGNOSIS — R070 Pain in throat: Secondary | ICD-10-CM | POA: Diagnosis not present

## 2020-08-02 DIAGNOSIS — R05 Cough: Secondary | ICD-10-CM | POA: Diagnosis not present

## 2020-08-07 IMAGING — US US EXTREM LOW VENOUS*L*
1 series · 13 of 24 positions shown · non-contrast
Comparison: None.

CLINICAL DATA: 68-year-old with left lower extremity pain. Recent
history of arthroscopy.



[Series 1: us venous img lower uni left (dvt) · portal-venous · 13 of 52 slices shown]
[im 1/52]
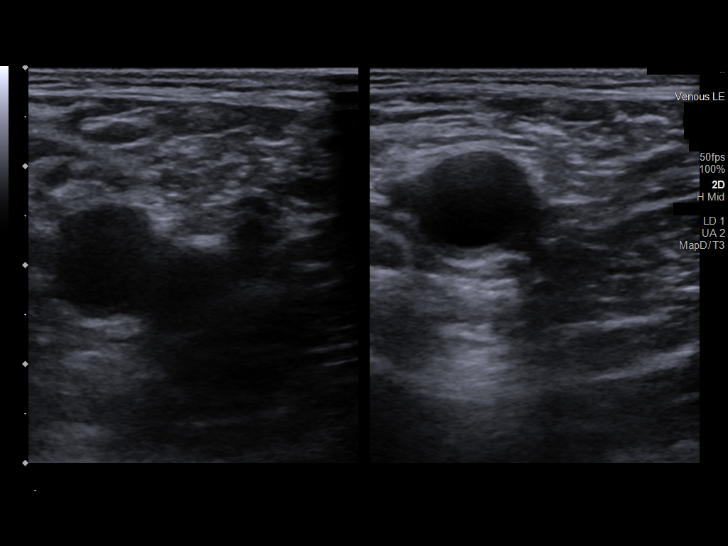
[im 5/52]
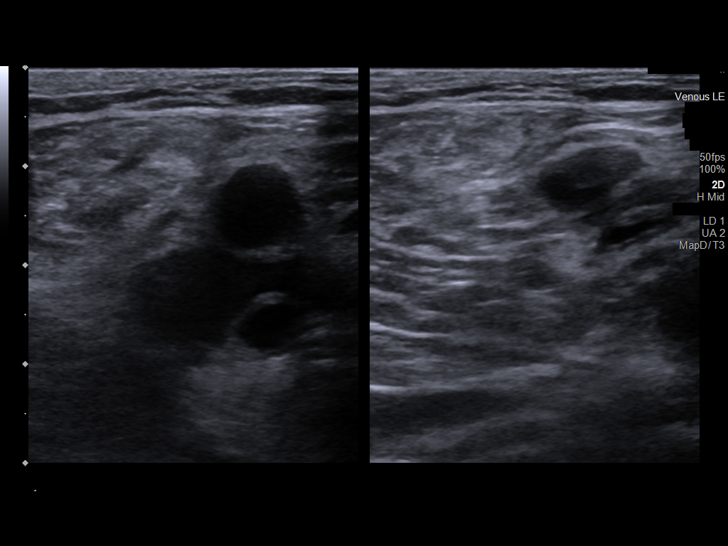
[im 9/52]
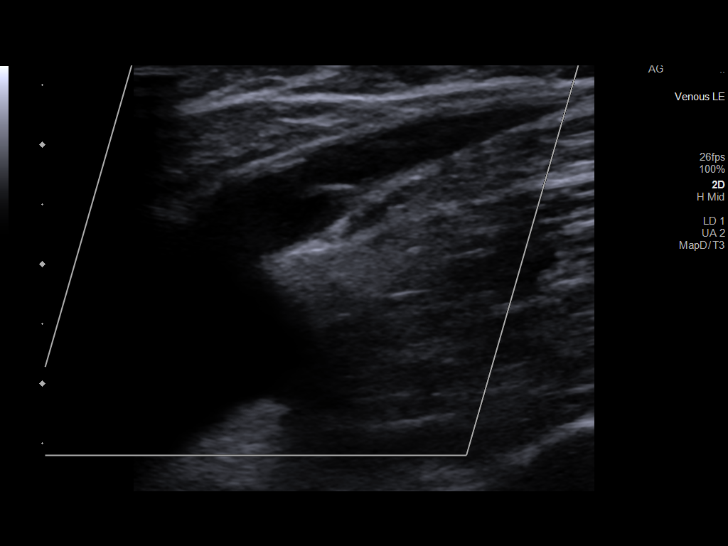
[im 14/52]
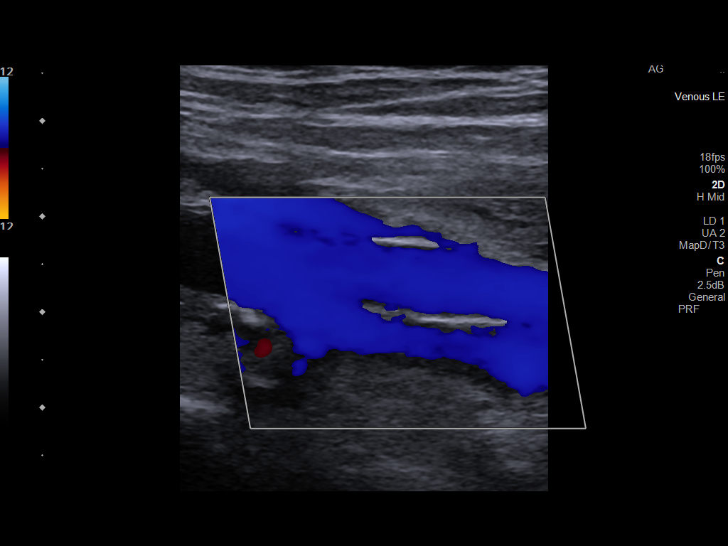
[im 18/52]
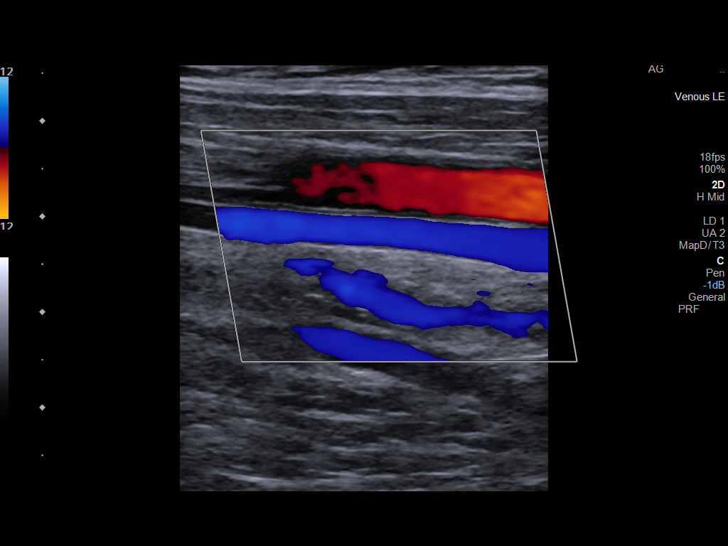
[im 23/52]
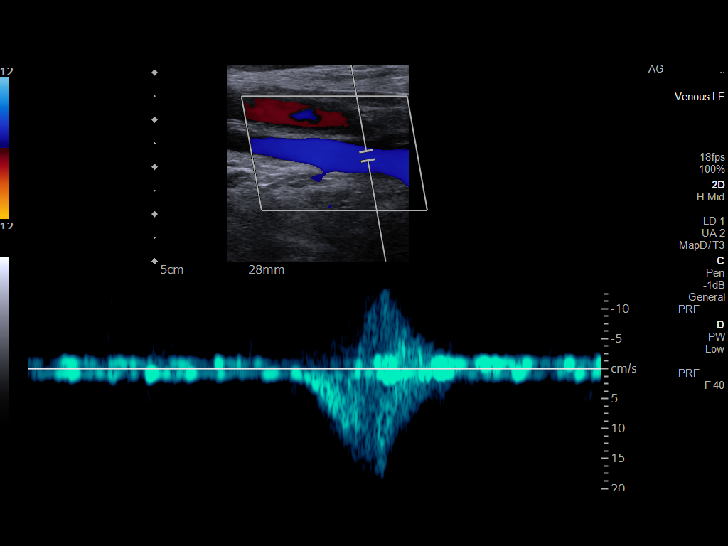
[im 27/52]
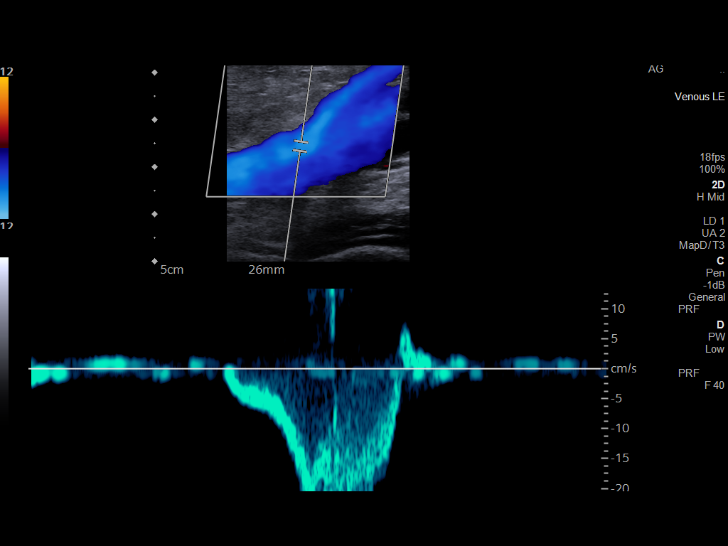
[im 29/52]
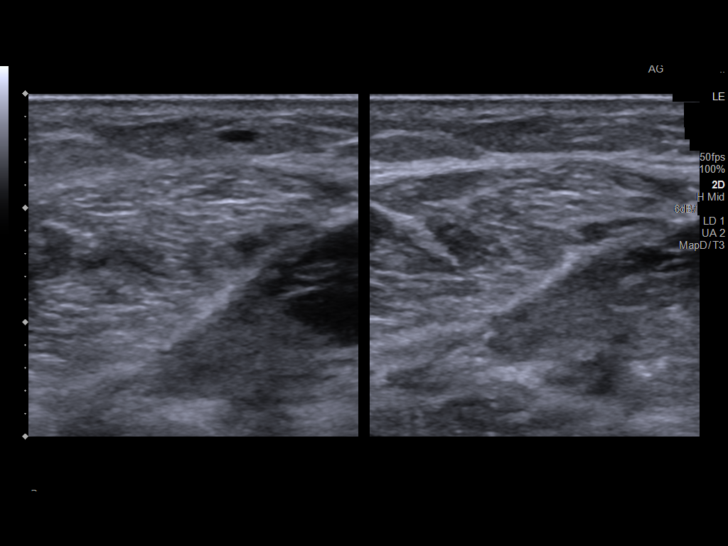
[im 34/52]
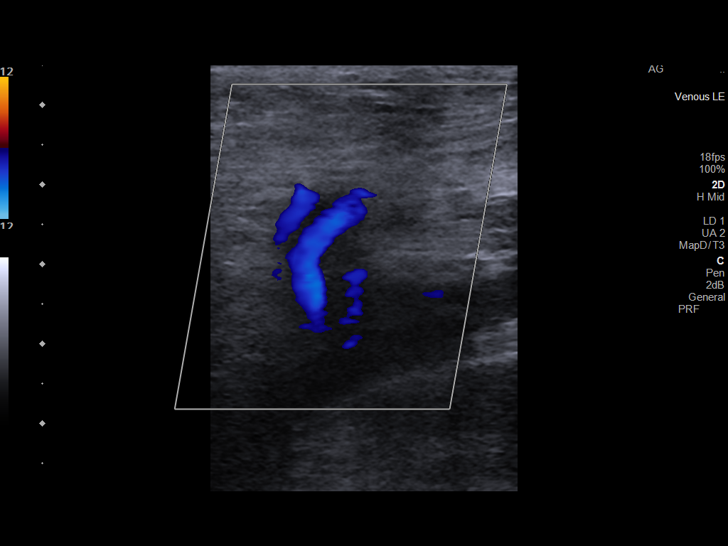
[im 38/52]
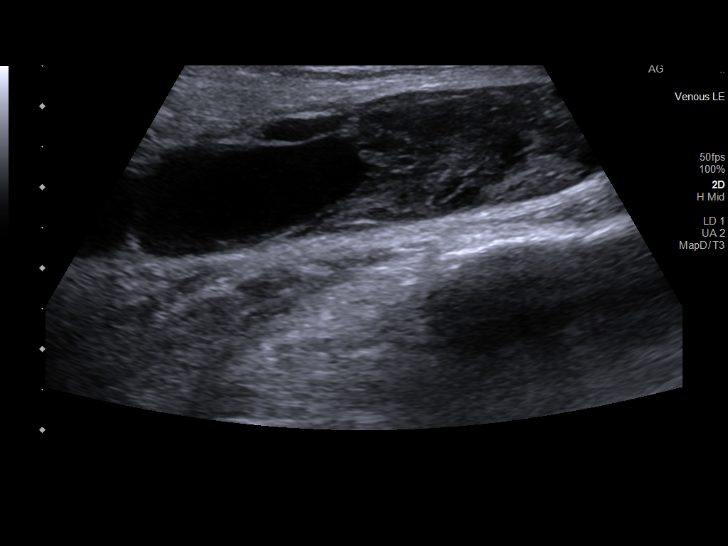
[im 43/52]
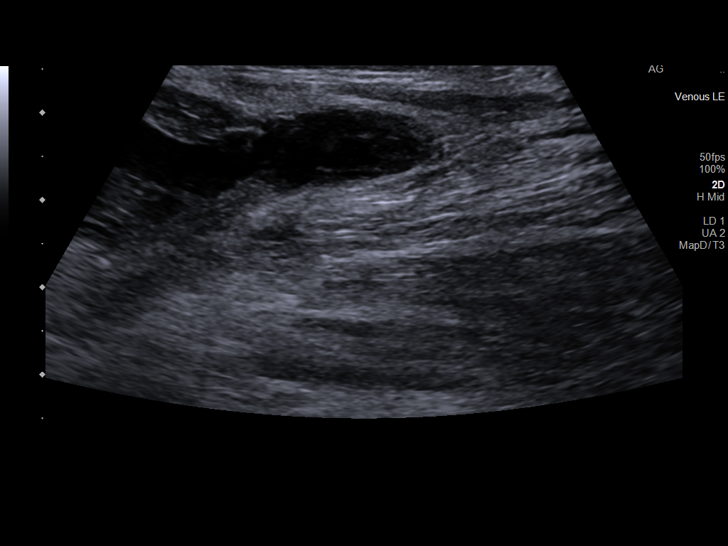
[im 47/52]
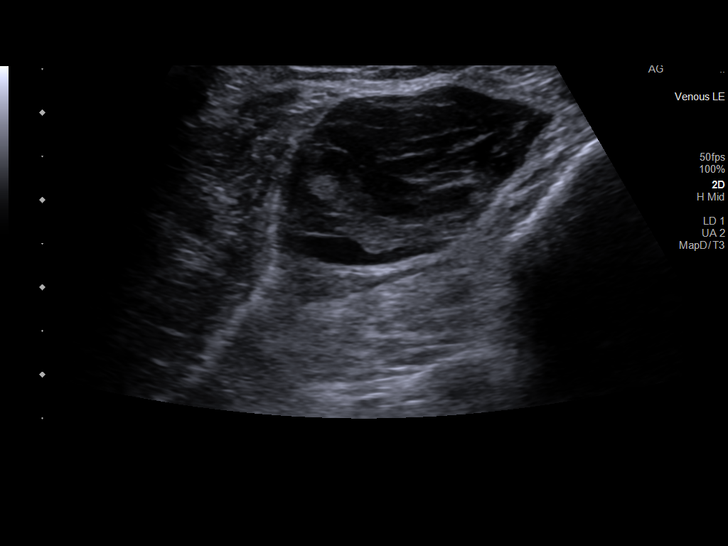
[im 52/52]
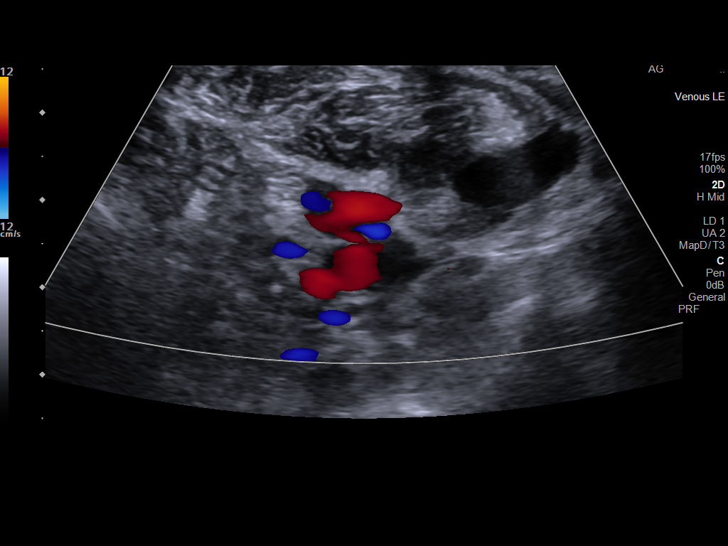

[13 of 24 positions shown; findings below may reference images not displayed]

FINDINGS: Contralateral Common Femoral Vein: Respiratory phasicity is normal
and symmetric with the symptomatic side. No evidence of thrombus.
Normal compressibility.

Common Femoral Vein: No evidence of thrombus. Normal
compressibility, respiratory phasicity and response to augmentation.

Saphenofemoral Junction: No evidence of thrombus. Normal
compressibility and flow on color Doppler imaging.

Profunda Femoral Vein: No evidence of thrombus. Normal
compressibility and flow on color Doppler imaging.

Femoral Vein: No evidence of thrombus. Normal compressibility,
respiratory phasicity and response to augmentation.

Popliteal Vein: No evidence of thrombus. Normal compressibility,
respiratory phasicity and response to augmentation.

Calf Veins: Visualized left deep calf veins are patent without
thrombus.

Superficial Great Saphenous Vein: No evidence of thrombus. Normal
compressibility.

Other Findings: Complex fluid collection in the left popliteal fossa
and calf with septations. This collection measures 4.0 x 1.3 cm in
the transverse dimension. The overall length of this collection was
not measured but extends down the left calf.
IMPRESSION: 1.  Negative for deep venous thrombosis in left lower extremity.
2. Complex fluid collection in left popliteal fossa and left calf.
Findings could be associated with a complex Baker's cyst and/or
related to recent surgery.

## 2020-08-31 DIAGNOSIS — Z6820 Body mass index (BMI) 20.0-20.9, adult: Secondary | ICD-10-CM | POA: Diagnosis not present

## 2020-08-31 DIAGNOSIS — M79643 Pain in unspecified hand: Secondary | ICD-10-CM | POA: Diagnosis not present

## 2020-08-31 DIAGNOSIS — J309 Allergic rhinitis, unspecified: Secondary | ICD-10-CM | POA: Diagnosis not present

## 2020-08-31 DIAGNOSIS — M6588 Other synovitis and tenosynovitis, other site: Secondary | ICD-10-CM | POA: Diagnosis not present

## 2020-08-31 DIAGNOSIS — G3184 Mild cognitive impairment, so stated: Secondary | ICD-10-CM | POA: Diagnosis not present

## 2020-08-31 DIAGNOSIS — R5383 Other fatigue: Secondary | ICD-10-CM | POA: Diagnosis not present

## 2020-08-31 DIAGNOSIS — J019 Acute sinusitis, unspecified: Secondary | ICD-10-CM | POA: Diagnosis not present

## 2020-08-31 DIAGNOSIS — J060 Acute laryngopharyngitis: Secondary | ICD-10-CM | POA: Diagnosis not present

## 2020-08-31 DIAGNOSIS — Z8546 Personal history of malignant neoplasm of prostate: Secondary | ICD-10-CM | POA: Diagnosis not present

## 2020-08-31 DIAGNOSIS — E782 Mixed hyperlipidemia: Secondary | ICD-10-CM | POA: Diagnosis not present

## 2020-08-31 DIAGNOSIS — Z8719 Personal history of other diseases of the digestive system: Secondary | ICD-10-CM | POA: Diagnosis not present

## 2020-08-31 DIAGNOSIS — G4700 Insomnia, unspecified: Secondary | ICD-10-CM | POA: Diagnosis not present

## 2020-09-05 DIAGNOSIS — G4700 Insomnia, unspecified: Secondary | ICD-10-CM | POA: Diagnosis not present

## 2020-09-05 DIAGNOSIS — H9193 Unspecified hearing loss, bilateral: Secondary | ICD-10-CM | POA: Diagnosis not present

## 2020-09-05 DIAGNOSIS — Z8546 Personal history of malignant neoplasm of prostate: Secondary | ICD-10-CM | POA: Diagnosis not present

## 2020-09-05 DIAGNOSIS — Z8719 Personal history of other diseases of the digestive system: Secondary | ICD-10-CM | POA: Diagnosis not present

## 2020-09-05 DIAGNOSIS — E782 Mixed hyperlipidemia: Secondary | ICD-10-CM | POA: Diagnosis not present

## 2020-09-05 DIAGNOSIS — G3184 Mild cognitive impairment, so stated: Secondary | ICD-10-CM | POA: Diagnosis not present

## 2020-09-06 DIAGNOSIS — J060 Acute laryngopharyngitis: Secondary | ICD-10-CM | POA: Diagnosis not present

## 2020-09-06 DIAGNOSIS — G3184 Mild cognitive impairment, so stated: Secondary | ICD-10-CM | POA: Diagnosis not present

## 2020-09-06 DIAGNOSIS — G4700 Insomnia, unspecified: Secondary | ICD-10-CM | POA: Diagnosis not present

## 2020-09-06 DIAGNOSIS — E782 Mixed hyperlipidemia: Secondary | ICD-10-CM | POA: Diagnosis not present

## 2020-09-06 DIAGNOSIS — Z6820 Body mass index (BMI) 20.0-20.9, adult: Secondary | ICD-10-CM | POA: Diagnosis not present

## 2020-09-06 DIAGNOSIS — J019 Acute sinusitis, unspecified: Secondary | ICD-10-CM | POA: Diagnosis not present

## 2020-09-06 DIAGNOSIS — J069 Acute upper respiratory infection, unspecified: Secondary | ICD-10-CM | POA: Diagnosis not present

## 2020-09-06 DIAGNOSIS — Z8546 Personal history of malignant neoplasm of prostate: Secondary | ICD-10-CM | POA: Diagnosis not present

## 2020-09-06 DIAGNOSIS — J309 Allergic rhinitis, unspecified: Secondary | ICD-10-CM | POA: Diagnosis not present

## 2020-09-06 DIAGNOSIS — R5383 Other fatigue: Secondary | ICD-10-CM | POA: Diagnosis not present

## 2020-09-06 DIAGNOSIS — Z20822 Contact with and (suspected) exposure to covid-19: Secondary | ICD-10-CM | POA: Diagnosis not present

## 2020-09-06 DIAGNOSIS — R070 Pain in throat: Secondary | ICD-10-CM | POA: Diagnosis not present

## 2020-09-06 DIAGNOSIS — R05 Cough: Secondary | ICD-10-CM | POA: Diagnosis not present

## 2020-09-07 DIAGNOSIS — R000 Tachycardia, unspecified: Secondary | ICD-10-CM | POA: Diagnosis not present

## 2020-09-07 DIAGNOSIS — U071 COVID-19: Secondary | ICD-10-CM | POA: Diagnosis not present

## 2020-09-08 ENCOUNTER — Encounter: Payer: Self-pay | Admitting: Nurse Practitioner

## 2020-09-08 ENCOUNTER — Other Ambulatory Visit (HOSPITAL_COMMUNITY): Payer: Self-pay | Admitting: Nurse Practitioner

## 2020-09-08 DIAGNOSIS — U071 COVID-19: Secondary | ICD-10-CM

## 2020-09-08 NOTE — Progress Notes (Signed)
I connected by phone with Dominic Oliver on 09/08/2020 at 3:40 PM to discuss the potential use of an new treatment for mild to moderate COVID-19 viral infection in non-hospitalized patients.  This patient is a 69 y.o. male that meets the FDA criteria for Emergency Use Authorization of casirivimab\imdevimab.  Has a (+) direct SARS-CoV-2 viral test result  Has mild or moderate COVID-19   Is ? 67 years of age and weighs ? 40 kg  Is NOT hospitalized due to COVID-19  Is NOT requiring oxygen therapy or requiring an increase in baseline oxygen flow rate due to COVID-19  Is within 10 days of symptom onset  Has at least one of the high risk factor(s) for progression to severe COVID-19 and/or hospitalization as defined in EUA.  Specific high risk criteria : Older age (>/= 67 yo)   I have spoken and communicated the following to the patient or parent/caregiver:  1. FDA has authorized the emergency use of casirivimab\imdevimab for the treatment of mild to moderate COVID-19 in adults and pediatric patients with positive results of direct SARS-CoV-2 viral testing who are 46 years of age and older weighing at least 40 kg, and who are at high risk for progressing to severe COVID-19 and/or hospitalization.  2. The significant known and potential risks and benefits of casirivimab\imdevimab, and the extent to which such potential risks and benefits are unknown.  3. Information on available alternative treatments and the risks and benefits of those alternatives, including clinical trials.  4. Patients treated with casirivimab\imdevimab should continue to self-isolate and use infection control measures (e.g., wear mask, isolate, social distance, avoid sharing personal items, clean and disinfect "high touch" surfaces, and frequent handwashing) according to CDC guidelines.   5. The patient or parent/caregiver has the option to accept or refuse casirivimab\imdevimab .  After reviewing this information with the  patient, The patient agreed to proceed with receiving casirivimab\imdevimab infusion and will be provided a copy of the Fact sheet prior to receiving the infusion.Dominic Oliver, [place], AGNP-C [phone] ([place])

## 2020-09-10 ENCOUNTER — Ambulatory Visit (HOSPITAL_COMMUNITY)
Admission: RE | Admit: 2020-09-10 | Discharge: 2020-09-10 | Disposition: A | Discharge status: Home / Self Care | Payer: Medicare Other | Source: Ambulatory Visit | Attending: Pulmonary Disease | Admitting: Pulmonary Disease

## 2020-09-10 ENCOUNTER — Other Ambulatory Visit (HOSPITAL_COMMUNITY): Payer: Self-pay

## 2020-09-10 DIAGNOSIS — U071 COVID-19: Secondary | ICD-10-CM | POA: Insufficient documentation

## 2020-09-10 DIAGNOSIS — R54 Age-related physical debility: Secondary | ICD-10-CM | POA: Insufficient documentation

## 2020-09-10 DIAGNOSIS — Z23 Encounter for immunization: Secondary | ICD-10-CM | POA: Insufficient documentation

## 2020-09-10 MED ORDER — SODIUM CHLORIDE 0.9 % IV SOLN: INTRAVENOUS | Status: DC | PRN

## 2020-09-10 MED ORDER — ALBUTEROL SULFATE HFA 108 (90 BASE) MCG/ACT IN AERS: 2.0000 | INHALATION_SPRAY | Freq: Once | RESPIRATORY_TRACT | Status: DC | PRN

## 2020-09-10 MED ORDER — METHYLPREDNISOLONE SODIUM SUCC 125 MG IJ SOLR: 125.0000 mg | Freq: Once | INTRAMUSCULAR | Status: DC | PRN

## 2020-09-10 MED ORDER — EPINEPHRINE 0.3 MG/0.3ML IJ SOAJ: 0.3000 mg | Freq: Once | INTRAMUSCULAR | Status: DC | PRN

## 2020-09-10 MED ORDER — FAMOTIDINE IN NACL 20-0.9 MG/50ML-% IV SOLN: 20.0000 mg | Freq: Once | INTRAVENOUS | Status: DC | PRN

## 2020-09-10 MED ORDER — SODIUM CHLORIDE 0.9 % IV SOLN
1200.0000 mg | Freq: Once | INTRAVENOUS | Status: AC
  Administered 2020-09-10: 1200 mg via INTRAVENOUS
  Filled 2020-09-10: qty 10

## 2020-09-10 MED ORDER — DIPHENHYDRAMINE HCL 50 MG/ML IJ SOLN: 50.0000 mg | Freq: Once | INTRAMUSCULAR | Status: DC | PRN

## 2020-09-10 NOTE — Progress Notes (Signed)
  Diagnosis: COVID-19  Physician:Dr Joya Gaskins  Procedure: [facility] Med: casirivimab\imdevimab infusion - Provided patient with casirivimab\imdevimab fact sheet for patients, parents and caregivers prior to infusion.  Complications: No immediate complications noted.  Discharge: Discharged home   Robert Crevier, [place] 09/10/2020

## 2020-09-10 NOTE — Discharge Instructions (Signed)

## 2020-09-11 DIAGNOSIS — U071 COVID-19: Secondary | ICD-10-CM | POA: Diagnosis not present

## 2020-09-11 DIAGNOSIS — R000 Tachycardia, unspecified: Secondary | ICD-10-CM | POA: Diagnosis not present

## 2020-09-17 DIAGNOSIS — R000 Tachycardia, unspecified: Secondary | ICD-10-CM | POA: Diagnosis not present

## 2020-09-17 DIAGNOSIS — U071 COVID-19: Secondary | ICD-10-CM | POA: Diagnosis not present

## 2020-09-26 ENCOUNTER — Ambulatory Visit (INDEPENDENT_AMBULATORY_CARE_PROVIDER_SITE_OTHER): Payer: Medicare Other

## 2020-09-26 ENCOUNTER — Ambulatory Visit
Admission: EM | Admit: 2020-09-26 | Discharge: 2020-09-26 | Disposition: A | Discharge status: Home / Self Care | Payer: Medicare Other

## 2020-09-26 ENCOUNTER — Other Ambulatory Visit: Payer: Self-pay

## 2020-09-26 DIAGNOSIS — R0602 Shortness of breath: Secondary | ICD-10-CM | POA: Diagnosis not present

## 2020-09-26 DIAGNOSIS — U071 COVID-19: Secondary | ICD-10-CM

## 2020-09-26 MED ORDER — ALBUTEROL SULFATE HFA 108 (90 BASE) MCG/ACT IN AERS: 1.0000 | INHALATION_SPRAY | Freq: Four times a day (QID) | RESPIRATORY_TRACT | 0 refills | Status: AC | PRN

## 2020-09-26 NOTE — Discharge Instructions (Addendum)
Get plenty of rest and push fluids ProAir was prescribed for shortness of breath/take as directed Use medications daily for symptom relief Use OTC medications like ibuprofen or tylenol as needed fever or pain Follow up with PCP  Call or go to the ED if you have any new or worsening symptoms such as fever, worsening cough, shortness of breath, chest tightness, chest pain, turning blue, changes in mental status, etc..Marland Kitchen

## 2020-09-26 NOTE — ED Provider Notes (Addendum)
[place]   [id number] 09/26/20 Arrival Time: 4496   CC: SOB SUBJECTIVE: History from: patient.  Dominic Oliver is a 69 y.o. male who presents to the urgent care for complaint of shortness of breath while working this morning. Patient tested positive for COVID-19 and received multiple antibiotic on 09/10/2020.  Denies recent travel. Denies aggravating factors. Denies previous symptoms in the past.   Denies fever, chills, fatigue, sinus pain, rhinorrhea, sore throat,  wheezing, chest pain, nausea, changes in bowel or bladder habits.     ROS: As per HPI.  All other pertinent ROS negative.     Past Medical History:  Diagnosis Date  . Arthritis    knees, spine, thumbs  . Diverticulitis   . GERD (gastroesophageal reflux disease)   . HLD (hyperlipidemia) 03/26/2017  . Peptic ulcer disease   . Prostate cancer [facility])    prostate  . Restless leg syndrome    amitripytline helps   Past Surgical History:  Procedure Laterality Date  . HERNIA REPAIR    . INGUINAL HERNIA REPAIR Right 09/29/2014   Procedure: OPEN RIGHT IINGUINAL HERNIA REPAIR WITH MESH;  Surgeon: Gayland Curry, MD;  Location: [facility];  Service: General;  Laterality: Right;  . INSERTION OF MESH Right 09/29/2014   Procedure: INSERTION OF MESH;  Surgeon: Gayland Curry, MD;  Location: [facility];  Service: General;  Laterality: Right;  . KNEE ARTHROSCOPY  10/2003  . LYMPHADENECTOMY Bilateral 03/18/2013   Procedure: LYMPHADENECTOMY;  Surgeon: Alexis Frock, MD;  Location: [facility];  Service: Urology;  Laterality: Bilateral;  . NASAL POLYP EXCISION  1989  . ROBOT ASSISTED LAPAROSCOPIC RADICAL PROSTATECTOMY N/A 03/18/2013   Procedure: ROBOTIC ASSISTED LAPAROSCOPIC RADICAL PROSTATECTOMY;  Surgeon: Alexis Frock, MD;  Location: [facility];  Service: Urology;  Laterality: N/A;   No Known Allergies No current facility-administered medications on file prior to encounter.   Current Outpatient Medications on File Prior to Encounter    Medication Sig Dispense Refill  . traZODone (DESYREL) 50 MG tablet Take 50 mg by mouth at bedtime.    . cholecalciferol (VITAMIN D) 1000 units tablet Take 2,000 Units by mouth daily.    . Cyanocobalamin (VITAMIN B 12 PO) Take by mouth.    . cyclobenzaprine (FLEXERIL) 5 MG tablet Take 1 tablet (5 mg total) by mouth 3 (three) times daily as needed for muscle spasms. 30 tablet 0  . ibuprofen (ADVIL,MOTRIN) 200 MG tablet Take 400 mg by mouth once as needed for headache or mild pain.    Marland Kitchen KRILL OIL PO Take by mouth.     Social History   Socioeconomic History  . Marital status: Married    Spouse name: Clinical cytogeneticist  . Number of children: 2  . Years of education: 84  . Highest education level: Not on file  Occupational History  . Occupation: retired  Tobacco Use  . Smoking status: Former Smoker    Packs/day: 0.25    Years: 30.00    Pack years: 7.50    Quit date: 09/29/1999    Years since quitting: 21.0  . Smokeless tobacco: Never Used  . Tobacco comment: quit smoking oct 2000  Substance and Sexual Activity  . Alcohol use: Yes    Comment: rare beer  . Drug use: No  . Sexual activity: Yes  Other Topics Concern  . Not on file  Social History Narrative   Lives at home with wife Rebeccah   Grandson stays frequently      Likes to  walk, many projects   Social Determinants of Health   Financial Resource Strain:   . Difficulty of Paying Living Expenses: Not on file  Food Insecurity:   . Worried About Charity fundraiser in the Last Year: Not on file  . Ran Out of Food in the Last Year: Not on file  Transportation Needs:   . Lack of Transportation (Medical): Not on file  . Lack of Transportation (Non-Medical): Not on file  Physical Activity:   . Days of Exercise per Week: Not on file  . Minutes of Exercise per Session: Not on file  Stress:   . Feeling of Stress : Not on file  Social Connections:   . Frequency of Communication with Friends and Family: Not on file  . Frequency of  Social Gatherings with Friends and Family: Not on file  . Attends Religious Services: Not on file  . Active Member of Clubs or Organizations: Not on file  . Attends Archivist Meetings: Not on file  . Marital Status: Not on file  Intimate Partner Violence:   . Fear of Current or Ex-Partner: Not on file  . Emotionally Abused: Not on file  . Physically Abused: Not on file  . Sexually Abused: Not on file   Family History  Problem Relation Age of Onset  . Colon polyps Father   . Arthritis Father   . Cancer Father        skin, prostate  . Hearing loss Father   . Hypertension Father   . Stroke Father   . Cancer Mother        lung  . Heart disease Paternal Uncle   . Heart disease Paternal Uncle   . Cancer Paternal Uncle     OBJECTIVE:  Vitals:   09/26/20 0926 09/26/20 0928  BP:  110/66  Pulse:  79  Resp:  20  Temp:  97.9 F (36.6 C)  SpO2: 97% 97%     General appearance: alert; appears fatigued, but nontoxic; speaking in full sentences and tolerating own secretions HEENT: NCAT; Ears: EACs clear, TMs pearly gray; Eyes: PERRL.  EOM grossly intact. Sinuses: nontender; Nose: nares patent without rhinorrhea, Throat: oropharynx clear, tonsils non erythematous or enlarged, uvula midline  Neck: supple without LAD Lungs: unlabored respirations, symmetrical air entry; cough: absent; no respiratory distress; CTAB Heart: regular rate and rhythm.  Radial pulses 2+ symmetrical bilaterally Skin: warm and dry Psychological: alert and cooperative; normal mood and affect  LABS:  No results found for this or any previous visit (from the past 24 hour(s)).   RADIOLOGY:  DG Chest 2 View  Result Date: 09/26/2020 CLINICAL DATA:  Shortness of breath, recent COVID EXAM: CHEST - 2 VIEW COMPARISON:  03/08/2013 FINDINGS: Mild hyperinflation. Heart and mediastinal contours are within normal limits. No focal opacities or effusions. No acute bony abnormality. IMPRESSION: Mild  hyperinflation.  No active disease. Electronically Signed   By: Rolm Baptise M.D.   On: 09/26/2020 09:45    Chest x-ray is negative for cardiopulmonary disease.  I have reviewed the x-ray myself and the radiologist interpretation.  I am in agreement with the radiologist interpretation.  ASSESSMENT & PLAN:  1. SOB (shortness of breath)     Meds ordered this encounter  Medications  . albuterol (VENTOLIN HFA) 108 (90 Base) MCG/ACT inhaler    Sig: Inhale 1-2 puffs into the lungs every 6 (six) hours as needed for wheezing or shortness of breath.    Dispense:  18 g  Refill:  0   Discharge instructions  Get plenty of rest and push fluids ProAir was prescribed for shortness of breath/take as directed Use medications daily for symptom relief Use OTC medications like ibuprofen or tylenol as needed fever or pain Follow up with PCP  Call or go to the ED if you have any new or worsening symptoms such as fever, worsening cough, shortness of breath, chest tightness, chest pain, turning blue, changes in mental status, etc...    Reviewed expectations re: course of current medical issues. Questions answered. Outlined signs and symptoms indicating need for more acute intervention. Patient verbalized understanding. After Visit Summary given.         Emerson Monte, FNP 09/26/20 1009    Emerson Monte, FNP 09/26/20 1010

## 2020-09-26 NOTE — ED Triage Notes (Signed)
Pt presents with c/o sob that developed this morning while walking dog . Pt in no distress O2 is 97% , received monocolonal infusion on 9/17

## 2020-10-01 DIAGNOSIS — J309 Allergic rhinitis, unspecified: Secondary | ICD-10-CM | POA: Diagnosis not present

## 2020-10-01 DIAGNOSIS — G4700 Insomnia, unspecified: Secondary | ICD-10-CM | POA: Diagnosis not present

## 2020-10-01 DIAGNOSIS — Z6820 Body mass index (BMI) 20.0-20.9, adult: Secondary | ICD-10-CM | POA: Diagnosis not present

## 2020-10-01 DIAGNOSIS — J060 Acute laryngopharyngitis: Secondary | ICD-10-CM | POA: Diagnosis not present

## 2020-10-01 DIAGNOSIS — Z20822 Contact with and (suspected) exposure to covid-19: Secondary | ICD-10-CM | POA: Diagnosis not present

## 2020-10-01 DIAGNOSIS — R070 Pain in throat: Secondary | ICD-10-CM | POA: Diagnosis not present

## 2020-10-01 DIAGNOSIS — R079 Chest pain, unspecified: Secondary | ICD-10-CM | POA: Diagnosis not present

## 2020-10-01 DIAGNOSIS — J069 Acute upper respiratory infection, unspecified: Secondary | ICD-10-CM | POA: Diagnosis not present

## 2020-10-01 DIAGNOSIS — G3184 Mild cognitive impairment, so stated: Secondary | ICD-10-CM | POA: Diagnosis not present

## 2020-10-01 DIAGNOSIS — R0602 Shortness of breath: Secondary | ICD-10-CM | POA: Diagnosis not present

## 2020-10-01 DIAGNOSIS — J019 Acute sinusitis, unspecified: Secondary | ICD-10-CM | POA: Diagnosis not present

## 2020-10-01 DIAGNOSIS — R000 Tachycardia, unspecified: Secondary | ICD-10-CM | POA: Diagnosis not present

## 2020-10-01 DIAGNOSIS — U071 COVID-19: Secondary | ICD-10-CM | POA: Diagnosis not present

## 2020-10-01 DIAGNOSIS — Z8616 Personal history of COVID-19: Secondary | ICD-10-CM | POA: Diagnosis not present

## 2020-10-01 DIAGNOSIS — R5383 Other fatigue: Secondary | ICD-10-CM | POA: Diagnosis not present

## 2020-10-31 DIAGNOSIS — Z8616 Personal history of COVID-19: Secondary | ICD-10-CM | POA: Diagnosis not present

## 2020-10-31 DIAGNOSIS — J9801 Acute bronchospasm: Secondary | ICD-10-CM | POA: Diagnosis not present

## 2020-10-31 DIAGNOSIS — R0602 Shortness of breath: Secondary | ICD-10-CM | POA: Diagnosis not present

## 2020-11-21 DIAGNOSIS — H5213 Myopia, bilateral: Secondary | ICD-10-CM | POA: Diagnosis not present

## 2020-11-21 DIAGNOSIS — H524 Presbyopia: Secondary | ICD-10-CM | POA: Diagnosis not present

## 2020-11-21 DIAGNOSIS — H25013 Cortical age-related cataract, bilateral: Secondary | ICD-10-CM | POA: Diagnosis not present

## 2020-11-21 DIAGNOSIS — H52223 Regular astigmatism, bilateral: Secondary | ICD-10-CM | POA: Diagnosis not present

## 2020-12-11 DIAGNOSIS — M7061 Trochanteric bursitis, right hip: Secondary | ICD-10-CM | POA: Diagnosis not present

## 2020-12-11 DIAGNOSIS — M545 Low back pain, unspecified: Secondary | ICD-10-CM | POA: Diagnosis not present

## 2020-12-13 ENCOUNTER — Ambulatory Visit: Payer: Medicare Other | Attending: Internal Medicine

## 2020-12-13 DIAGNOSIS — Z23 Encounter for immunization: Secondary | ICD-10-CM

## 2020-12-13 NOTE — Progress Notes (Signed)
   Covid-19 Vaccination Clinic  Name:  Dominic Oliver    MRN: [id number] DOB: 1951/07/04  12/13/2020  Dominic Oliver was observed post Covid-19 immunization for 15 minutes without incident. He was provided with Vaccine Information Sheet and instruction to access the V-Safe system.   Dominic Oliver was instructed to call 911 with any severe reactions post vaccine: Marland Kitchen Difficulty breathing  . Swelling of face and throat  . A fast heartbeat  . A bad rash all over body  . Dizziness and weakness   Immunizations Administered    No immunizations on file.

## 2021-03-18 DIAGNOSIS — Z8616 Personal history of COVID-19: Secondary | ICD-10-CM | POA: Diagnosis not present

## 2021-03-18 DIAGNOSIS — L239 Allergic contact dermatitis, unspecified cause: Secondary | ICD-10-CM | POA: Diagnosis not present

## 2021-03-18 DIAGNOSIS — J060 Acute laryngopharyngitis: Secondary | ICD-10-CM | POA: Diagnosis not present

## 2021-03-18 DIAGNOSIS — R0602 Shortness of breath: Secondary | ICD-10-CM | POA: Diagnosis not present

## 2021-03-18 DIAGNOSIS — G4700 Insomnia, unspecified: Secondary | ICD-10-CM | POA: Diagnosis not present

## 2021-03-18 DIAGNOSIS — J069 Acute upper respiratory infection, unspecified: Secondary | ICD-10-CM | POA: Diagnosis not present

## 2021-03-18 DIAGNOSIS — U071 COVID-19: Secondary | ICD-10-CM | POA: Diagnosis not present

## 2021-03-18 DIAGNOSIS — R5383 Other fatigue: Secondary | ICD-10-CM | POA: Diagnosis not present

## 2021-03-18 DIAGNOSIS — J019 Acute sinusitis, unspecified: Secondary | ICD-10-CM | POA: Diagnosis not present

## 2021-03-18 DIAGNOSIS — E785 Hyperlipidemia, unspecified: Secondary | ICD-10-CM | POA: Diagnosis not present

## 2021-04-15 DIAGNOSIS — M1611 Unilateral primary osteoarthritis, right hip: Secondary | ICD-10-CM | POA: Diagnosis not present

## 2021-04-15 DIAGNOSIS — M5136 Other intervertebral disc degeneration, lumbar region: Secondary | ICD-10-CM | POA: Diagnosis not present

## 2021-04-15 DIAGNOSIS — M419 Scoliosis, unspecified: Secondary | ICD-10-CM | POA: Diagnosis not present

## 2021-04-24 DIAGNOSIS — M25551 Pain in right hip: Secondary | ICD-10-CM | POA: Diagnosis not present

## 2021-05-28 DIAGNOSIS — M62830 Muscle spasm of back: Secondary | ICD-10-CM | POA: Diagnosis not present

## 2021-05-28 DIAGNOSIS — M9902 Segmental and somatic dysfunction of thoracic region: Secondary | ICD-10-CM | POA: Diagnosis not present

## 2021-05-28 DIAGNOSIS — M9903 Segmental and somatic dysfunction of lumbar region: Secondary | ICD-10-CM | POA: Diagnosis not present

## 2021-05-28 DIAGNOSIS — M9905 Segmental and somatic dysfunction of pelvic region: Secondary | ICD-10-CM | POA: Diagnosis not present

## 2021-05-28 DIAGNOSIS — M25551 Pain in right hip: Secondary | ICD-10-CM | POA: Diagnosis not present

## 2021-06-03 DIAGNOSIS — M9905 Segmental and somatic dysfunction of pelvic region: Secondary | ICD-10-CM | POA: Diagnosis not present

## 2021-06-03 DIAGNOSIS — M25551 Pain in right hip: Secondary | ICD-10-CM | POA: Diagnosis not present

## 2021-06-03 DIAGNOSIS — M62830 Muscle spasm of back: Secondary | ICD-10-CM | POA: Diagnosis not present

## 2021-06-03 DIAGNOSIS — M9902 Segmental and somatic dysfunction of thoracic region: Secondary | ICD-10-CM | POA: Diagnosis not present

## 2021-06-03 DIAGNOSIS — M9903 Segmental and somatic dysfunction of lumbar region: Secondary | ICD-10-CM | POA: Diagnosis not present

## 2021-06-08 ENCOUNTER — Emergency Department (HOSPITAL_COMMUNITY)
Admission: EM | Admit: 2021-06-08 | Discharge: 2021-06-08 | Disposition: A | Discharge status: Home / Self Care | Payer: Medicare HMO | Attending: Emergency Medicine | Admitting: Emergency Medicine

## 2021-06-08 ENCOUNTER — Emergency Department (HOSPITAL_COMMUNITY): Payer: Medicare HMO

## 2021-06-08 ENCOUNTER — Other Ambulatory Visit: Payer: Self-pay

## 2021-06-08 DIAGNOSIS — R059 Cough, unspecified: Secondary | ICD-10-CM | POA: Diagnosis not present

## 2021-06-08 DIAGNOSIS — R0602 Shortness of breath: Secondary | ICD-10-CM | POA: Diagnosis not present

## 2021-06-08 DIAGNOSIS — Z20822 Contact with and (suspected) exposure to covid-19: Secondary | ICD-10-CM | POA: Insufficient documentation

## 2021-06-08 DIAGNOSIS — Z87891 Personal history of nicotine dependence: Secondary | ICD-10-CM | POA: Insufficient documentation

## 2021-06-08 DIAGNOSIS — Z8545 Personal history of malignant neoplasm of unspecified male genital organ: Secondary | ICD-10-CM | POA: Insufficient documentation

## 2021-06-08 DIAGNOSIS — I10 Essential (primary) hypertension: Secondary | ICD-10-CM | POA: Diagnosis not present

## 2021-06-08 DIAGNOSIS — J45909 Unspecified asthma, uncomplicated: Secondary | ICD-10-CM | POA: Diagnosis not present

## 2021-06-08 LAB — TROPONIN I (HIGH SENSITIVITY)
Troponin I (High Sensitivity): <2 ng/L (ref <18)
Troponin I (High Sensitivity): <2 ng/L (ref <18)

## 2021-06-08 LAB — BASIC METABOLIC PANEL
Anion gap: 5 (ref 5–15)
BUN: 9 mg/dL (ref 8–23)
CO2: 28 mmol/L (ref 22–32)
Calcium: 8.8 mg/dL — ABNORMAL LOW (ref 8.9–10.3)
Chloride: 105 mmol/L (ref 98–111)
Creatinine, Ser: 0.88 mg/dL (ref 0.61–1.24)
GFR, Estimated: >60 mL/min (ref >60)
Glucose, Bld: 104 mg/dL — ABNORMAL HIGH (ref 70–99)
Potassium: 3.8 mmol/L (ref 3.5–5.1)
Sodium: 138 mmol/L (ref 135–145)

## 2021-06-08 LAB — D-DIMER, QUANTITATIVE: D-Dimer, Quant: 0.46 ug/mL-FEU (ref 0.00–0.50)

## 2021-06-08 LAB — CBC WITH DIFFERENTIAL/PLATELET
Abs Immature Granulocytes: 0.01 10*3/uL (ref 0.00–0.07)
Basophils Absolute: 0 10*3/uL (ref 0.0–0.1)
Basophils Relative: 0 %
Eosinophils Absolute: 0.3 10*3/uL (ref 0.0–0.5)
Eosinophils Relative: 5 %
HCT: 42.2 % (ref 39.0–52.0)
Hemoglobin: 14.6 g/dL (ref 13.0–17.0)
Immature Granulocytes: 0 %
Lymphocytes Relative: 12 %
Lymphs Abs: 0.7 10*3/uL (ref 0.7–4.0)
MCH: 32.9 pg (ref 26.0–34.0)
MCHC: 34.6 g/dL (ref 30.0–36.0)
MCV: 95 fL (ref 80.0–100.0)
Monocytes Absolute: 0.4 10*3/uL (ref 0.1–1.0)
Monocytes Relative: 7 %
Neutro Abs: 4.5 10*3/uL (ref 1.7–7.7)
Neutrophils Relative %: 76 %
Platelets: 196 10*3/uL (ref 150–400)
RBC: 4.44 MIL/uL (ref 4.22–5.81)
RDW: 12.5 % (ref 11.5–15.5)
WBC: 6 10*3/uL (ref 4.0–10.5)
nRBC: 0 % (ref 0.0–0.2)

## 2021-06-08 LAB — RESP PANEL BY RT-PCR (FLU A&B, COVID) ARPGX2
Influenza A by PCR: NEGATIVE
Influenza B by PCR: NEGATIVE
SARS Coronavirus 2 by RT PCR: NEGATIVE

## 2021-06-08 MED ORDER — ALBUTEROL SULFATE HFA 108 (90 BASE) MCG/ACT IN AERS
2.0000 | INHALATION_SPRAY | Freq: Once | RESPIRATORY_TRACT | Status: AC
  Administered 2021-06-08: 2 via RESPIRATORY_TRACT
  Filled 2021-06-08: qty 6.7

## 2021-06-08 NOTE — ED Triage Notes (Signed)
Patient states he woke up this morning at 0200 with sudden onset of SHOB with cough. Patient states increased weakness as well. Denies chest pain.

## 2021-06-08 NOTE — ED Provider Notes (Signed)
[facility] EMERGENCY DEPARTMENT Provider Note   CSN: [id number] Arrival date & time: 06/08/21  0998     History Chief Complaint  Patient presents with   Shortness of Breath    Dominic Oliver is a 70 y.o. male.  He has a history of prostate cancer GERD former smoker.  He said he woke up at 2 AM this morning with shortness of breath.  Intermittent cough.  Feels generally weak.  No chest pain.  No fevers or chills nausea vomiting diarrhea.  No recent falls.  No medication changes, only takes trazodone at night for sleep.  The history is provided by the patient.  Shortness of Breath Severity:  Moderate Onset quality:  Sudden Duration:  5 hours Timing:  Constant Progression:  Unchanged Chronicity:  New Relieved by:  Nothing Worsened by:  Nothing Ineffective treatments:  Position changes Associated symptoms: cough   Associated symptoms: no abdominal pain, no chest pain, no diaphoresis, no fever, no headaches, no neck pain, no rash, no sore throat, no sputum production, no syncope, no vomiting and no wheezing   Risk factors: hx of cancer       Past Medical History:  Diagnosis Date   Arthritis    knees, spine, thumbs   Diverticulitis    GERD (gastroesophageal reflux disease)    HLD (hyperlipidemia) 03/26/2017   Peptic ulcer disease    Prostate cancer ([place])    prostate   Restless leg syndrome    amitripytline helps    Patient Active Problem List   Diagnosis Date Noted   Scoliosis deformity of spine 05/28/2017   HLD (hyperlipidemia) 03/26/2017   RLS (restless legs syndrome) 02/26/2017   GERD (gastroesophageal reflux disease) 02/26/2017   Environmental and seasonal allergies 02/26/2017   Personal history of prostate cancer 33/82/5053   Umbilical hernia 97/67/3419    Past Surgical History:  Procedure Laterality Date   HERNIA REPAIR     INGUINAL HERNIA REPAIR Right 09/29/2014   Procedure: OPEN RIGHT IINGUINAL HERNIA REPAIR WITH MESH;  Surgeon: Gayland Curry, MD;   Location: [facility];  Service: General;  Laterality: Right;   INSERTION OF MESH Right 09/29/2014   Procedure: INSERTION OF MESH;  Surgeon: Gayland Curry, MD;  Location: [facility];  Service: General;  Laterality: Right;   KNEE ARTHROSCOPY  10/2003   LYMPHADENECTOMY Bilateral 03/18/2013   Procedure: LYMPHADENECTOMY;  Surgeon: Alexis Frock, MD;  Location: [facility];  Service: Urology;  Laterality: Bilateral;   NASAL POLYP EXCISION  1989   ROBOT ASSISTED LAPAROSCOPIC RADICAL PROSTATECTOMY N/A 03/18/2013   Procedure: ROBOTIC ASSISTED LAPAROSCOPIC RADICAL PROSTATECTOMY;  Surgeon: Alexis Frock, MD;  Location: [facility];  Service: Urology;  Laterality: N/A;       Family History  Problem Relation Age of Onset   Colon polyps Father    Arthritis Father    Cancer Father        skin, prostate   Hearing loss Father    Hypertension Father    Stroke Father    Cancer Mother        lung   Heart disease Paternal Uncle    Heart disease Paternal Uncle    Cancer Paternal Uncle     Social History   Tobacco Use   Smoking status: Former    Packs/day: 0.25    Years: 30.00    Pack years: 7.50    Types: Cigarettes    Quit date: 09/29/1999    Years since quitting: 21.7   Smokeless tobacco: Never  Tobacco comments:    quit smoking oct 2000  Substance Use Topics   Alcohol use: Yes    Comment: rare beer   Drug use: No    Home Medications Prior to Admission medications   Medication Sig Start Date End Date Taking? Authorizing Provider  albuterol (VENTOLIN HFA) 108 (90 Base) MCG/ACT inhaler Inhale 1-2 puffs into the lungs every 6 (six) hours as needed for wheezing or shortness of breath. 09/26/20   Avegno, Darrelyn Hillock, FNP  cholecalciferol (VITAMIN D) 1000 units tablet Take 2,000 Units by mouth daily.    [provider]  Cyanocobalamin (VITAMIN B 12 PO) Take by mouth.    [provider]  cyclobenzaprine (FLEXERIL) 5 MG tablet Take 1 tablet (5 mg total) by mouth 3 (three) times daily as needed  for muscle spasms. 05/28/17   Raylene Everts, MD  ibuprofen (ADVIL,MOTRIN) 200 MG tablet Take 400 mg by mouth once as needed for headache or mild pain.    [provider]  KRILL OIL PO Take by mouth.    [provider]  traZODone (DESYREL) 50 MG tablet Take 50 mg by mouth at bedtime.    [provider]    Allergies    Patient has no known allergies.  Review of Systems   Review of Systems  Constitutional:  Negative for diaphoresis and fever.  HENT:  Negative for sore throat.   Eyes:  Negative for visual disturbance.  Respiratory:  Positive for cough and shortness of breath. Negative for sputum production and wheezing.   Cardiovascular:  Negative for chest pain and syncope.  Gastrointestinal:  Negative for abdominal pain and vomiting.  Genitourinary:  Negative for dysuria.  Musculoskeletal:  Negative for neck pain.  Skin:  Negative for rash.  Neurological:  Negative for headaches.   Physical Exam Updated Vital Signs BP 119/68   Pulse 92   Resp 18   Ht 6' (1.829 m)   Wt 63.5 kg   SpO2 100%   BMI 18.99 kg/m   Physical Exam Vitals and nursing note reviewed.  Constitutional:      Appearance: He is well-developed.  HENT:     Head: Normocephalic and atraumatic.  Eyes:     Conjunctiva/sclera: Conjunctivae normal.  Cardiovascular:     Rate and Rhythm: Normal rate and regular rhythm.     Heart sounds: No murmur heard. Pulmonary:     Effort: Pulmonary effort is normal. No respiratory distress.     Breath sounds: Normal breath sounds.  Abdominal:     Palpations: Abdomen is soft.     Tenderness: There is no abdominal tenderness.  Musculoskeletal:        General: Normal range of motion.     Cervical back: Neck supple.     Right lower leg: No tenderness. No edema.     Left lower leg: No tenderness. No edema.  Skin:    General: Skin is warm and dry.     Capillary Refill: Capillary refill takes less than 2 seconds.  Neurological:     General: No  focal deficit present.     Mental Status: He is alert.    ED Results / Procedures / Treatments   Labs (all labs ordered are listed, but only abnormal results are displayed) Labs Reviewed  BASIC METABOLIC PANEL - Abnormal; Notable for the following components:      Result Value   Glucose, Bld 104 (*)    Calcium 8.8 (*)    All other components  within normal limits  RESP PANEL BY RT-PCR (FLU A&B, COVID) ARPGX2  CBC WITH DIFFERENTIAL/PLATELET  D-DIMER, QUANTITATIVE  TROPONIN I (HIGH SENSITIVITY)  TROPONIN I (HIGH SENSITIVITY)    EKG EKG Interpretation  Date/Time:  Saturday June 08 2021 08:24:29 EDT Ventricular Rate:  79 PR Interval:  156 QRS Duration: 82 QT Interval:  345 QTC Calculation: 396 R Axis:   88 Text Interpretation: Sinus rhythm Borderline right axis deviation No significant change since prior 3/14 Confirmed by Aletta Edouard [phone]) on 06/08/2021 8:29:31 AM  Radiology DG Chest Port 1 View  Result Date: 06/08/2021 CLINICAL DATA:  Shortness of breath. Asthma. Diabetes. Hypertension. EXAM: PORTABLE CHEST 1 VIEW COMPARISON:  09/26/2020 FINDINGS: Midline trachea. Normal heart size. Numerous leads and wires project over the chest. The left costophrenic angle is excluded from the film. No right pleural fluid or pneumothorax. Hyperinflation. Lower lung predominant interstitial thickening. No lobar consolidation. Nodular density projecting over the right lung base laterally is most likely a nipple shadow. IMPRESSION: Hyperinflation and interstitial thickening, consistent with COPD/chronic bronchitis. No acute superimposed process. Probable right-sided nipple shadow. Repeat frontal radiograph with nipple markers could confirm. Electronically Signed   By: Abigail Miyamoto M.D.   On: 06/08/2021 08:46    Procedures Procedures   Medications Ordered in ED Medications  albuterol (VENTOLIN HFA) 108 (90 Base) MCG/ACT inhaler 2 puff (2 puffs Inhalation Given 06/08/21 1049)    ED Course   I have reviewed the triage vital signs and the nursing notes.  Pertinent labs & imaging results that were available during my care of the patient were reviewed by me and considered in my medical decision making (see chart for details).  Clinical Course as of 06/08/21 1731  Sat Jun 08, 2021  0830 Chest x-ray interpreted by me as no acute infiltrates.  Awaiting radiology reading. [MB]  5053 Patient's work-up has been unremarkable.  He is ambulated in the department without any desaturations.  Will order albuterol inhaler with spacer and have him start taking some allergy medicine at home.  Return instructions discussed [MB]    Clinical Course User Index [MB] Hayden Rasmussen, MD   MDM Rules/Calculators/A&P                         This patient complains of shortness of breath; this involves an extensive number of treatment Options and is a complaint that carries with it a high risk of complications and Morbidity. The differential includes COVID, pneumonia, COPD, PE, pneumothorax, anemia, ACS  I ordered, reviewed and interpreted labs, which included CBC with normal white count normal hemoglobin, chemistries normal, troponin flat, D-dimer unremarkable, COVID and flu testing negative I ordered medication albuterol inhaler with some improvement in his symptoms I ordered imaging studies which included chest x-ray and I independently    visualized and interpreted imaging which showed no acute findings Additional history obtained from patient's son, he said he has had some exacerbations of the shortness of breath since his COVID infection. Previous records obtained and reviewed in epic, no recent admissions  After the interventions stated above, I reevaluated the patient and found patient still to be satting 100% on room air.  He ambulated in the department without any difficulties.  We will have him continue to use the inhaler and follow-up with his PCP.  Return instructions discussed   Final  Clinical Impression(s) / ED Diagnoses Final diagnoses:  SOB (shortness of breath)    Rx / DC Orders ED  Discharge Orders     None        Hayden Rasmussen, MD 06/08/21 1734

## 2021-06-08 NOTE — ED Notes (Signed)
Patient ambulated around nurses station, gait steady, tolerated well. O2 sat 96%-97% on room air while ambulating. Dr Melina Copa made aware.

## 2021-06-08 NOTE — Discharge Instructions (Signed)
You were seen in the emergency department for evaluation of shortness of breath.  You had a chest x-ray EKG and blood work that did not show any obvious explanation for your symptoms.  Please use the albuterol inhaler 2 puffs every few 4 to 6 hours as needed.  Also try some allergy medication such as Claritin.  Follow-up with your doctor.  Return to the emergency department if any worsening or concerning symptoms

## 2021-06-28 DIAGNOSIS — M9903 Segmental and somatic dysfunction of lumbar region: Secondary | ICD-10-CM | POA: Diagnosis not present

## 2021-06-28 DIAGNOSIS — M9902 Segmental and somatic dysfunction of thoracic region: Secondary | ICD-10-CM | POA: Diagnosis not present

## 2021-06-28 DIAGNOSIS — M62830 Muscle spasm of back: Secondary | ICD-10-CM | POA: Diagnosis not present

## 2021-06-28 DIAGNOSIS — M25551 Pain in right hip: Secondary | ICD-10-CM | POA: Diagnosis not present

## 2021-06-28 DIAGNOSIS — M9905 Segmental and somatic dysfunction of pelvic region: Secondary | ICD-10-CM | POA: Diagnosis not present

## 2021-07-02 DIAGNOSIS — M25551 Pain in right hip: Secondary | ICD-10-CM | POA: Diagnosis not present

## 2021-07-02 DIAGNOSIS — M9905 Segmental and somatic dysfunction of pelvic region: Secondary | ICD-10-CM | POA: Diagnosis not present

## 2021-07-02 DIAGNOSIS — M62830 Muscle spasm of back: Secondary | ICD-10-CM | POA: Diagnosis not present

## 2021-07-02 DIAGNOSIS — M9902 Segmental and somatic dysfunction of thoracic region: Secondary | ICD-10-CM | POA: Diagnosis not present

## 2021-07-02 DIAGNOSIS — M9903 Segmental and somatic dysfunction of lumbar region: Secondary | ICD-10-CM | POA: Diagnosis not present

## 2021-07-05 DIAGNOSIS — M9903 Segmental and somatic dysfunction of lumbar region: Secondary | ICD-10-CM | POA: Diagnosis not present

## 2021-07-05 DIAGNOSIS — M9905 Segmental and somatic dysfunction of pelvic region: Secondary | ICD-10-CM | POA: Diagnosis not present

## 2021-07-05 DIAGNOSIS — M25551 Pain in right hip: Secondary | ICD-10-CM | POA: Diagnosis not present

## 2021-07-05 DIAGNOSIS — M62830 Muscle spasm of back: Secondary | ICD-10-CM | POA: Diagnosis not present

## 2021-07-05 DIAGNOSIS — M9902 Segmental and somatic dysfunction of thoracic region: Secondary | ICD-10-CM | POA: Diagnosis not present

## 2021-07-10 DIAGNOSIS — M9902 Segmental and somatic dysfunction of thoracic region: Secondary | ICD-10-CM | POA: Diagnosis not present

## 2021-07-10 DIAGNOSIS — M9905 Segmental and somatic dysfunction of pelvic region: Secondary | ICD-10-CM | POA: Diagnosis not present

## 2021-07-10 DIAGNOSIS — M9903 Segmental and somatic dysfunction of lumbar region: Secondary | ICD-10-CM | POA: Diagnosis not present

## 2021-07-10 DIAGNOSIS — M62830 Muscle spasm of back: Secondary | ICD-10-CM | POA: Diagnosis not present

## 2021-07-10 DIAGNOSIS — M25551 Pain in right hip: Secondary | ICD-10-CM | POA: Diagnosis not present

## 2021-07-15 DIAGNOSIS — M9905 Segmental and somatic dysfunction of pelvic region: Secondary | ICD-10-CM | POA: Diagnosis not present

## 2021-07-15 DIAGNOSIS — M25551 Pain in right hip: Secondary | ICD-10-CM | POA: Diagnosis not present

## 2021-07-15 DIAGNOSIS — M62830 Muscle spasm of back: Secondary | ICD-10-CM | POA: Diagnosis not present

## 2021-07-15 DIAGNOSIS — M9903 Segmental and somatic dysfunction of lumbar region: Secondary | ICD-10-CM | POA: Diagnosis not present

## 2021-07-15 DIAGNOSIS — M9902 Segmental and somatic dysfunction of thoracic region: Secondary | ICD-10-CM | POA: Diagnosis not present

## 2021-07-26 ENCOUNTER — Other Ambulatory Visit: Payer: Self-pay | Admitting: Internal Medicine

## 2021-07-26 ENCOUNTER — Other Ambulatory Visit (HOSPITAL_COMMUNITY): Payer: Self-pay | Admitting: Internal Medicine

## 2021-07-26 DIAGNOSIS — R9389 Abnormal findings on diagnostic imaging of other specified body structures: Secondary | ICD-10-CM

## 2021-07-26 DIAGNOSIS — R0600 Dyspnea, unspecified: Secondary | ICD-10-CM | POA: Diagnosis not present

## 2021-07-26 DIAGNOSIS — J329 Chronic sinusitis, unspecified: Secondary | ICD-10-CM | POA: Diagnosis not present

## 2021-07-26 DIAGNOSIS — R0602 Shortness of breath: Secondary | ICD-10-CM

## 2021-07-29 ENCOUNTER — Other Ambulatory Visit (HOSPITAL_COMMUNITY): Payer: Self-pay | Admitting: Radiology

## 2021-07-29 DIAGNOSIS — R9389 Abnormal findings on diagnostic imaging of other specified body structures: Secondary | ICD-10-CM

## 2021-08-07 DIAGNOSIS — M9903 Segmental and somatic dysfunction of lumbar region: Secondary | ICD-10-CM | POA: Diagnosis not present

## 2021-08-07 DIAGNOSIS — M9902 Segmental and somatic dysfunction of thoracic region: Secondary | ICD-10-CM | POA: Diagnosis not present

## 2021-08-07 DIAGNOSIS — M25551 Pain in right hip: Secondary | ICD-10-CM | POA: Diagnosis not present

## 2021-08-07 DIAGNOSIS — M9905 Segmental and somatic dysfunction of pelvic region: Secondary | ICD-10-CM | POA: Diagnosis not present

## 2021-08-07 DIAGNOSIS — M62830 Muscle spasm of back: Secondary | ICD-10-CM | POA: Diagnosis not present

## 2021-08-09 ENCOUNTER — Other Ambulatory Visit: Payer: Self-pay

## 2021-08-09 ENCOUNTER — Other Ambulatory Visit (HOSPITAL_COMMUNITY)
Admission: RE | Admit: 2021-08-09 | Discharge: 2021-08-09 | Disposition: A | Discharge status: Home / Self Care | Payer: Medicare HMO | Source: Ambulatory Visit | Attending: Internal Medicine | Admitting: Internal Medicine

## 2021-08-09 DIAGNOSIS — Z01812 Encounter for preprocedural laboratory examination: Secondary | ICD-10-CM | POA: Diagnosis not present

## 2021-08-09 DIAGNOSIS — Z20822 Contact with and (suspected) exposure to covid-19: Secondary | ICD-10-CM | POA: Diagnosis not present

## 2021-08-09 LAB — SARS CORONAVIRUS 2 (TAT 6-24 HRS): SARS Coronavirus 2: NEGATIVE

## 2021-08-12 ENCOUNTER — Ambulatory Visit (HOSPITAL_COMMUNITY)
Admission: RE | Admit: 2021-08-12 | Discharge: 2021-08-12 | Disposition: A | Discharge status: Home / Self Care | Payer: Medicare HMO | Source: Ambulatory Visit | Attending: Internal Medicine | Admitting: Internal Medicine

## 2021-08-12 ENCOUNTER — Other Ambulatory Visit: Payer: Self-pay

## 2021-08-12 DIAGNOSIS — R0609 Other forms of dyspnea: Secondary | ICD-10-CM | POA: Diagnosis not present

## 2021-08-12 DIAGNOSIS — Z87891 Personal history of nicotine dependence: Secondary | ICD-10-CM | POA: Diagnosis not present

## 2021-08-12 DIAGNOSIS — R9389 Abnormal findings on diagnostic imaging of other specified body structures: Secondary | ICD-10-CM | POA: Insufficient documentation

## 2021-08-12 LAB — PULMONARY FUNCTION TEST
DL/VA % pred: 90 %
DL/VA: 3.67 ml/min/mmHg/L
DLCO unc % pred: 101 %
DLCO unc: 28.44 ml/min/mmHg
FEF 25-75 Post: 2.72 L/sec
FEF 25-75 Pre: 2.66 L/sec
FEF2575-%Change-Post: 2 %
FEF2575-%Pred-Post: 96 %
FEF2575-%Pred-Pre: 94 %
FEV1-%Change-Post: 0 %
FEV1-%Pred-Post: 118 %
FEV1-%Pred-Pre: 117 %
FEV1-Post: 4.28 L
FEV1-Pre: 4.25 L
FEV1FVC-%Change-Post: 3 %
FEV1FVC-%Pred-Pre: 91 %
FEV6-%Change-Post: -1 %
FEV6-%Pred-Post: 126 %
FEV6-%Pred-Pre: 128 %
FEV6-Post: 5.85 L
FEV6-Pre: 5.95 L
FEV6FVC-%Change-Post: 1 %
FEV6FVC-%Pred-Post: 101 %
FEV6FVC-%Pred-Pre: 100 %
FVC-%Change-Post: -2 %
FVC-%Pred-Post: 124 %
FVC-%Pred-Pre: 127 %
FVC-Post: 6.07 L
FVC-Pre: 6.24 L
Post FEV1/FVC ratio: 71 %
Post FEV6/FVC ratio: 96 %
Pre FEV1/FVC ratio: 68 %
Pre FEV6/FVC Ratio: 95 %
RV % pred: 129 %
RV: 3.23 L
TLC % pred: 126 %
TLC: 9.37 L

## 2021-08-12 MED ORDER — ALBUTEROL SULFATE (2.5 MG/3ML) 0.083% IN NEBU
2.5000 mg | INHALATION_SOLUTION | Freq: Once | RESPIRATORY_TRACT | Status: AC
  Administered 2021-08-12: 2.5 mg via RESPIRATORY_TRACT

## 2021-08-14 ENCOUNTER — Ambulatory Visit (HOSPITAL_COMMUNITY)
Admission: RE | Admit: 2021-08-14 | Discharge: 2021-08-14 | Disposition: A | Discharge status: Home / Self Care | Payer: Medicare HMO | Source: Ambulatory Visit | Attending: Internal Medicine | Admitting: Internal Medicine

## 2021-08-14 ENCOUNTER — Other Ambulatory Visit: Payer: Self-pay

## 2021-08-14 DIAGNOSIS — M25551 Pain in right hip: Secondary | ICD-10-CM | POA: Diagnosis not present

## 2021-08-14 DIAGNOSIS — M9902 Segmental and somatic dysfunction of thoracic region: Secondary | ICD-10-CM | POA: Diagnosis not present

## 2021-08-14 DIAGNOSIS — M9903 Segmental and somatic dysfunction of lumbar region: Secondary | ICD-10-CM | POA: Diagnosis not present

## 2021-08-14 DIAGNOSIS — M62830 Muscle spasm of back: Secondary | ICD-10-CM | POA: Diagnosis not present

## 2021-08-14 DIAGNOSIS — R9389 Abnormal findings on diagnostic imaging of other specified body structures: Secondary | ICD-10-CM | POA: Insufficient documentation

## 2021-08-14 DIAGNOSIS — R0602 Shortness of breath: Secondary | ICD-10-CM | POA: Diagnosis not present

## 2021-08-14 DIAGNOSIS — R918 Other nonspecific abnormal finding of lung field: Secondary | ICD-10-CM | POA: Diagnosis not present

## 2021-08-14 DIAGNOSIS — M9905 Segmental and somatic dysfunction of pelvic region: Secondary | ICD-10-CM | POA: Diagnosis not present

## 2021-08-14 DIAGNOSIS — R911 Solitary pulmonary nodule: Secondary | ICD-10-CM | POA: Diagnosis not present

## 2021-08-21 ENCOUNTER — Other Ambulatory Visit: Payer: Self-pay

## 2021-08-21 ENCOUNTER — Encounter (HOSPITAL_BASED_OUTPATIENT_CLINIC_OR_DEPARTMENT_OTHER): Payer: Self-pay

## 2021-08-21 ENCOUNTER — Emergency Department (HOSPITAL_BASED_OUTPATIENT_CLINIC_OR_DEPARTMENT_OTHER)
Admission: EM | Admit: 2021-08-21 | Discharge: 2021-08-21 | Disposition: A | Discharge status: Home / Self Care | Payer: Medicare HMO | Attending: Emergency Medicine | Admitting: Emergency Medicine

## 2021-08-21 DIAGNOSIS — X58XXXA Exposure to other specified factors, initial encounter: Secondary | ICD-10-CM | POA: Diagnosis not present

## 2021-08-21 DIAGNOSIS — J9801 Acute bronchospasm: Secondary | ICD-10-CM | POA: Diagnosis not present

## 2021-08-21 DIAGNOSIS — W5911XA Bitten by nonvenomous snake, initial encounter: Secondary | ICD-10-CM

## 2021-08-21 DIAGNOSIS — Z23 Encounter for immunization: Secondary | ICD-10-CM | POA: Insufficient documentation

## 2021-08-21 DIAGNOSIS — S6992XA Unspecified injury of left wrist, hand and finger(s), initial encounter: Secondary | ICD-10-CM | POA: Diagnosis present

## 2021-08-21 DIAGNOSIS — Z87891 Personal history of nicotine dependence: Secondary | ICD-10-CM | POA: Insufficient documentation

## 2021-08-21 DIAGNOSIS — Z8546 Personal history of malignant neoplasm of prostate: Secondary | ICD-10-CM | POA: Insufficient documentation

## 2021-08-21 DIAGNOSIS — T63001A Toxic effect of unspecified snake venom, accidental (unintentional), initial encounter: Secondary | ICD-10-CM | POA: Diagnosis not present

## 2021-08-21 DIAGNOSIS — T161XXA Foreign body in right ear, initial encounter: Secondary | ICD-10-CM | POA: Diagnosis not present

## 2021-08-21 DIAGNOSIS — H9201 Otalgia, right ear: Secondary | ICD-10-CM | POA: Diagnosis not present

## 2021-08-21 LAB — CBC WITH DIFFERENTIAL/PLATELET
Abs Immature Granulocytes: 0.01 10*3/uL (ref 0.00–0.07)
Basophils Absolute: 0 10*3/uL (ref 0.0–0.1)
Basophils Relative: 0 %
Eosinophils Absolute: 0.1 10*3/uL (ref 0.0–0.5)
Eosinophils Relative: 2 %
HCT: 37.6 % — ABNORMAL LOW (ref 39.0–52.0)
Hemoglobin: 12.9 g/dL — ABNORMAL LOW (ref 13.0–17.0)
Immature Granulocytes: 0 %
Lymphocytes Relative: 20 %
Lymphs Abs: 1 10*3/uL (ref 0.7–4.0)
MCH: 31.9 pg (ref 26.0–34.0)
MCHC: 34.3 g/dL (ref 30.0–36.0)
MCV: 93.1 fL (ref 80.0–100.0)
Monocytes Absolute: 0.5 10*3/uL (ref 0.1–1.0)
Monocytes Relative: 9 %
Neutro Abs: 3.6 10*3/uL (ref 1.7–7.7)
Neutrophils Relative %: 69 %
Platelets: 222 10*3/uL (ref 150–400)
RBC: 4.04 MIL/uL — ABNORMAL LOW (ref 4.22–5.81)
RDW: 12.3 % (ref 11.5–15.5)
WBC: 5.1 10*3/uL (ref 4.0–10.5)
nRBC: 0 % (ref 0.0–0.2)

## 2021-08-21 LAB — BASIC METABOLIC PANEL
Anion gap: 8 (ref 5–15)
BUN: 18 mg/dL (ref 8–23)
CO2: 26 mmol/L (ref 22–32)
Calcium: 8.8 mg/dL — ABNORMAL LOW (ref 8.9–10.3)
Chloride: 109 mmol/L (ref 98–111)
Creatinine, Ser: 0.88 mg/dL (ref 0.61–1.24)
GFR, Estimated: >60 mL/min (ref >60)
Glucose, Bld: 122 mg/dL — ABNORMAL HIGH (ref 70–99)
Potassium: 3.6 mmol/L (ref 3.5–5.1)
Sodium: 143 mmol/L (ref 135–145)

## 2021-08-21 LAB — APTT: aPTT: 30 seconds (ref 24–36)

## 2021-08-21 LAB — CK: Total CK: 98 U/L (ref 49–397)

## 2021-08-21 LAB — LACTATE DEHYDROGENASE: LDH: 112 U/L (ref 98–192)

## 2021-08-21 LAB — PROTIME-INR
INR: 1 (ref 0.8–1.2)
Prothrombin Time: 13.2 seconds (ref 11.4–15.2)

## 2021-08-21 LAB — FIBRINOGEN: Fibrinogen: 245 mg/dL (ref 210–475)

## 2021-08-21 MED ORDER — TETANUS-DIPHTH-ACELL PERTUSSIS 5-2.5-18.5 LF-MCG/0.5 IM SUSY
0.5000 mL | PREFILLED_SYRINGE | Freq: Once | INTRAMUSCULAR | Status: AC
  Administered 2021-08-21: 0.5 mL via INTRAMUSCULAR
  Filled 2021-08-21: qty 0.5

## 2021-08-21 NOTE — ED Triage Notes (Addendum)
Patient here POV from Home with [place].  Patient was bit by an approximately [place] at Home approximately at 2030. Patient was bit on the Left Middle Finger (Distal) just Beneath the Nail Bed.  NAD Noted during Triage. Ambulatory. GCS 15. No Swelling Noted. No CP. No SOB.

## 2021-08-21 NOTE — ED Provider Notes (Signed)
[name] Provider Note   CSN: [id number] Arrival date & time: 08/21/21  2131     History Chief Complaint  Patient presents with   Snake Bite    Dominic Oliver is a 70 y.o. male.  Patient is a 70 yo presenting for snake bite. Patient states directly prior to arrival he was bite by a black snake on this left middle finger above the nail. States black snake was in his home and he was attempting to grab it. Admits to minimal pain. Denies any swelling, redness, or drainage. Denies any previous snake bites.   The history is provided by the patient. No language interpreter was used.      Past Medical History:  Diagnosis Date   Arthritis    knees, spine, thumbs   Diverticulitis    GERD (gastroesophageal reflux disease)    HLD (hyperlipidemia) 03/26/2017   Peptic ulcer disease    Prostate cancer ([place])    prostate   Restless leg syndrome    amitripytline helps    Patient Active Problem List   Diagnosis Date Noted   Scoliosis deformity of spine 05/28/2017   HLD (hyperlipidemia) 03/26/2017   RLS (restless legs syndrome) 02/26/2017   GERD (gastroesophageal reflux disease) 02/26/2017   Environmental and seasonal allergies 02/26/2017   Personal history of prostate cancer [id number]   Umbilical hernia [id number]    Past Surgical History:  Procedure Laterality Date   HERNIA REPAIR     INGUINAL HERNIA REPAIR Right 09/29/2014   Procedure: OPEN RIGHT [place];  Surgeon: Gayland Curry, MD;  Location: [facility];  Service: General;  Laterality: Right;   INSERTION OF MESH Right 09/29/2014   Procedure: INSERTION OF MESH;  Surgeon: Gayland Curry, MD;  Location: [facility];  Service: General;  Laterality: Right;   KNEE ARTHROSCOPY  10/2003   LYMPHADENECTOMY Bilateral 03/18/2013   Procedure: LYMPHADENECTOMY;  Surgeon: Alexis Frock, MD;  Location: [facility];  Service: Urology;  Laterality: Bilateral;   NASAL POLYP EXCISION  1989   ROBOT ASSISTED  LAPAROSCOPIC RADICAL PROSTATECTOMY N/A 03/18/2013   Procedure: ROBOTIC ASSISTED LAPAROSCOPIC RADICAL PROSTATECTOMY;  Surgeon: Alexis Frock, MD;  Location: [facility];  Service: Urology;  Laterality: N/A;       Family History  Problem Relation Age of Onset   Colon polyps Father    Arthritis Father    Cancer Father        skin, prostate   Hearing loss Father    Hypertension Father    Stroke Father    Cancer Mother        lung   Heart disease Paternal Uncle    Heart disease Paternal Uncle    Cancer Paternal Uncle     Social History   Tobacco Use   Smoking status: Former    Packs/day: 0.25    Years: 30.00    Pack years: 7.50    Types: Cigarettes    Quit date: 09/29/1999    Years since quitting: 21.9   Smokeless tobacco: Never   Tobacco comments:    quit smoking oct 2000  Substance Use Topics   Alcohol use: Yes    Comment: rare beer   Drug use: No    Home Medications Prior to Admission medications   Medication Sig Start Date End Date Taking? Authorizing Provider  albuterol (VENTOLIN HFA) 108 (90 Base) MCG/ACT inhaler Inhale 1-2 puffs into the lungs every 6 (six) hours as needed for wheezing or shortness of  breath. 09/26/20   Avegno, Darrelyn Hillock, FNP  cholecalciferol (VITAMIN D) 1000 units tablet Take 2,000 Units by mouth daily.    [provider]  Cyanocobalamin (VITAMIN B 12 PO) Take by mouth.    [provider]  cyclobenzaprine (FLEXERIL) 5 MG tablet Take 1 tablet (5 mg total) by mouth 3 (three) times daily as needed for muscle spasms. 05/28/17   Raylene Everts, MD  ibuprofen (ADVIL,MOTRIN) 200 MG tablet Take 400 mg by mouth once as needed for headache or mild pain.    [provider]  KRILL OIL PO Take by mouth.    [provider]  traZODone (DESYREL) 50 MG tablet Take 50 mg by mouth at bedtime.    [provider]    Allergies    Patient has no known allergies.  Review of Systems   Review of Systems  Constitutional:   Negative for chills and fever.  HENT:  Negative for ear pain and sore throat.   Eyes:  Negative for pain and visual disturbance.  Respiratory:  Negative for cough and shortness of breath.   Cardiovascular:  Negative for chest pain and palpitations.  Gastrointestinal:  Negative for abdominal pain and vomiting.  Genitourinary:  Negative for dysuria and hematuria.  Musculoskeletal:  Negative for arthralgias and back pain.  Skin:  Positive for wound. Negative for color change and rash.  Neurological:  Negative for seizures and syncope.  All other systems reviewed and are negative.  Physical Exam Updated Vital Signs BP 130/74 (BP Location: Right Arm)   Pulse 97   Temp 98.2 F (36.8 C) (Oral)   Resp 16   Ht 6' (1.829 m)   Wt 62.6 kg   SpO2 98%   BMI 18.72 kg/m   Physical Exam Vitals and nursing note reviewed.  Constitutional:      Appearance: He is well-developed.  HENT:     Head: Normocephalic and atraumatic.  Eyes:     Conjunctiva/sclera: Conjunctivae normal.  Cardiovascular:     Rate and Rhythm: Normal rate and regular rhythm.     Heart sounds: No murmur heard. Pulmonary:     Effort: Pulmonary effort is normal. No respiratory distress.     Breath sounds: Normal breath sounds.  Abdominal:     Palpations: Abdomen is soft.     Tenderness: There is no abdominal tenderness.  Musculoskeletal:     Cervical back: Neck supple.  Skin:    General: Skin is warm and dry.       Neurological:     Mental Status: He is alert.  2 small superficial wounds proximal to nail bed on left middle finger. No bleeding. No swelling, warmth, or erythema. Motor function and sensation intact. Normal ulnar and radial pulses +2  ED Results / Procedures / Treatments   Labs (all labs ordered are listed, but only abnormal results are displayed) Labs Reviewed  CBC WITH DIFFERENTIAL/PLATELET  PROTIME-INR  APTT  FIBRINOGEN  BASIC METABOLIC PANEL  LACTATE DEHYDROGENASE  CK     EKG None  Radiology No results found.  Procedures Procedures   Medications Ordered in ED Medications  Tdap (BOOSTRIX) injection 0.5 mL (has no administration in time range)    ED Course  I have reviewed the triage vital signs and the nursing notes.  Pertinent labs & imaging results that were available during my care of the patient were reviewed by me and considered in my medical decision making (see chart for details).    MDM Rules/Calculators/A&P  10:09 PM 70 yo presenting for snake bite. Patient is Aox3,, no acute distress, afebrile, with stable vitals. Physical exam demonstrates 2 small superficial wounds proximal to nail bed on left middle finger. No bleeding. No swelling, warmth, or erythema. Motor function and sensation intact. Normal ulnar and radial pulses +2. Distal finger tip measuring 5 cm, proximal finger measuring 7cm.   Snake likely gardener snake, however, due to puncture wounds, and poison control recommending observation with serial finger measurements.   Tdap given.  Stable lab studies.   11:09 PM Repeat measurements demonstrate no changes. No swelling. No worsening pain.   Patient in no distress and overall condition improved here in the ED. Detailed discussions were had with the patient regarding current findings, and need for close f/u with PCP or on call doctor. The patient has been instructed to return immediately if the symptoms worsen in any way for re-evaluation. Patient verbalized understanding and is in agreement with current care plan. All questions answered prior to discharge.   Final Clinical Impression(s) / ED Diagnoses Final diagnoses:  Snake bite, initial encounter    Rx / DC Orders ED Discharge Orders     None        Lianne Cure, DO [id number]

## 2021-08-21 NOTE — ED Notes (Signed)
EDP at Bedside 

## 2021-08-24 IMAGING — DX DG CHEST 1V PORT
1 series · 1 of 1 positions shown · non-contrast
Comparison: 09/26/2020

CLINICAL DATA: Shortness of breath. Asthma. Diabetes. Hypertension.

EXAM:
PORTABLE CHEST 1 VIEW

[chest ap]
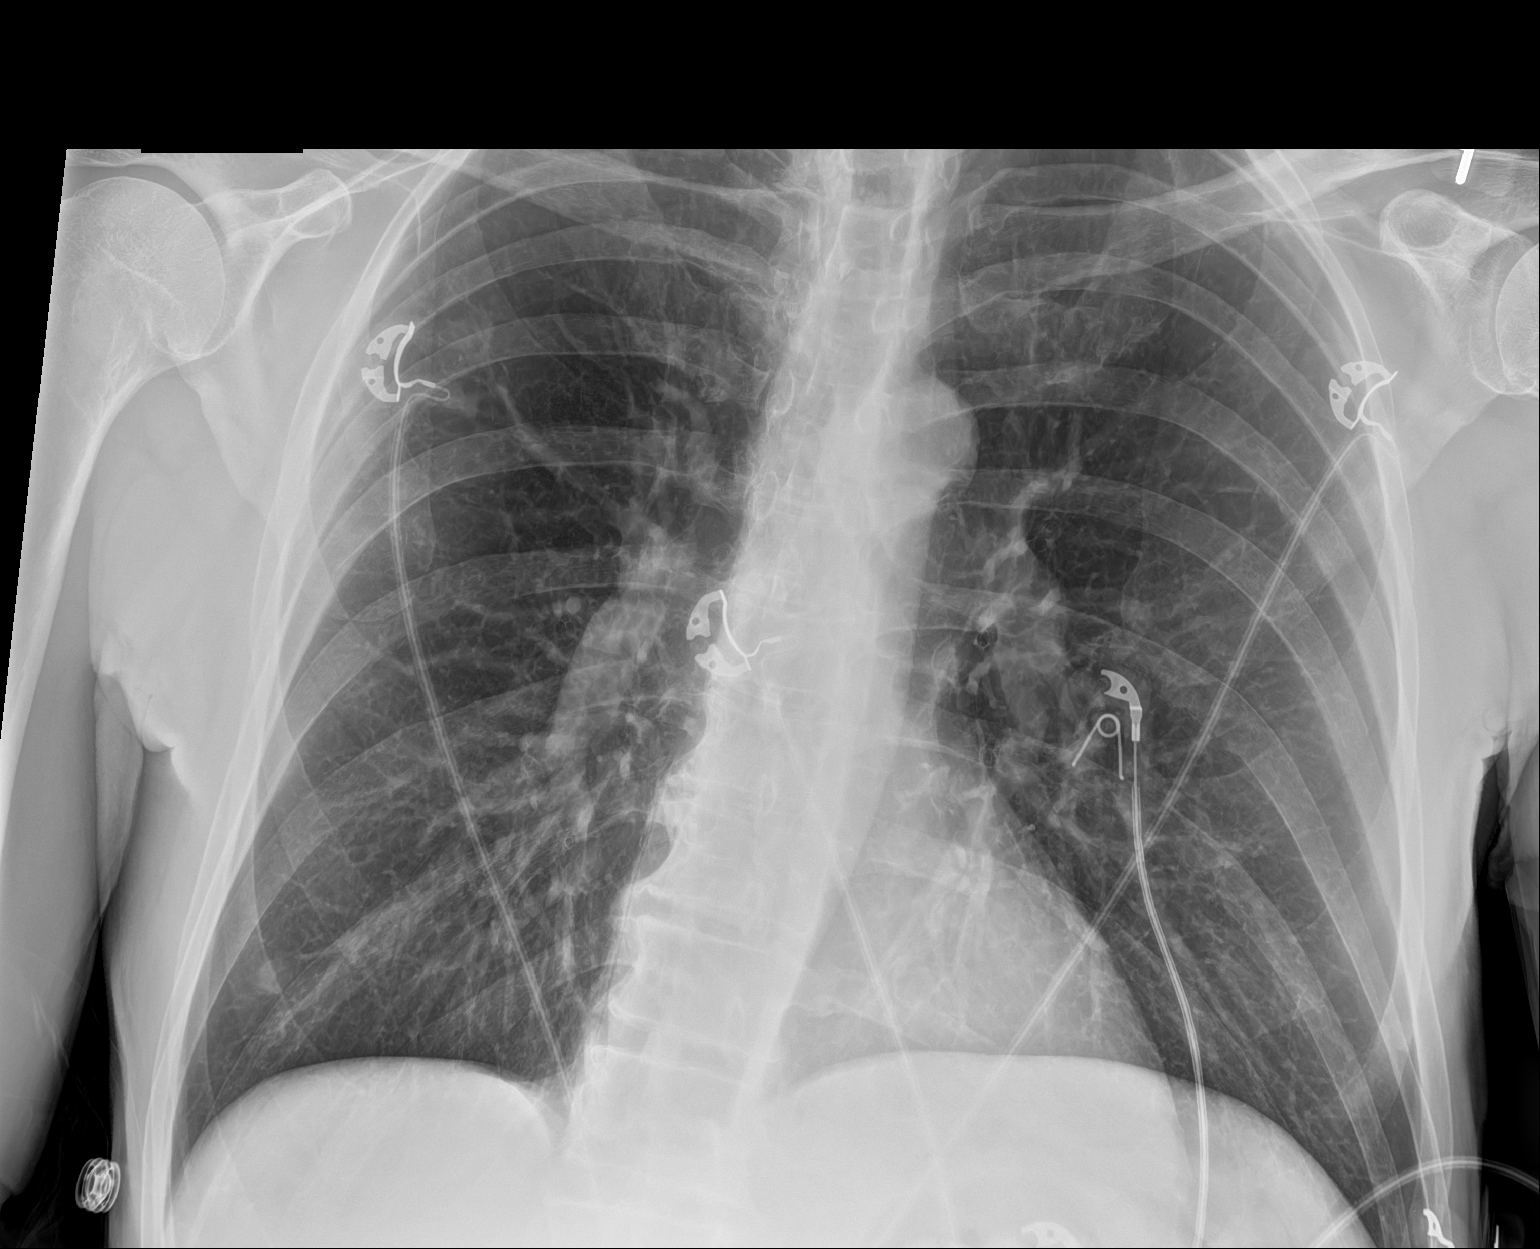

[1 of 1 positions shown; findings below may reference images not displayed]

FINDINGS: Midline trachea. Normal heart size. Numerous leads and wires project
over the chest. The left costophrenic angle is excluded from the
film. No right pleural fluid or pneumothorax. Hyperinflation. Lower
lung predominant interstitial thickening. No lobar consolidation.
Nodular density projecting over the right lung base laterally is
most likely a nipple shadow.
IMPRESSION: Hyperinflation and interstitial thickening, consistent with
COPD/chronic bronchitis. No acute superimposed process.

Probable right-sided nipple shadow. Repeat frontal radiograph with
nipple markers could confirm.

## 2021-09-18 DIAGNOSIS — R7301 Impaired fasting glucose: Secondary | ICD-10-CM | POA: Diagnosis not present

## 2021-09-18 DIAGNOSIS — Z8546 Personal history of malignant neoplasm of prostate: Secondary | ICD-10-CM | POA: Diagnosis not present

## 2021-09-18 DIAGNOSIS — E782 Mixed hyperlipidemia: Secondary | ICD-10-CM | POA: Diagnosis not present

## 2021-09-23 DIAGNOSIS — Z0001 Encounter for general adult medical examination with abnormal findings: Secondary | ICD-10-CM | POA: Diagnosis not present

## 2021-09-23 DIAGNOSIS — R413 Other amnesia: Secondary | ICD-10-CM | POA: Diagnosis not present

## 2021-09-23 DIAGNOSIS — E782 Mixed hyperlipidemia: Secondary | ICD-10-CM | POA: Diagnosis not present

## 2021-09-23 DIAGNOSIS — H9193 Unspecified hearing loss, bilateral: Secondary | ICD-10-CM | POA: Diagnosis not present

## 2021-09-23 DIAGNOSIS — G4700 Insomnia, unspecified: Secondary | ICD-10-CM | POA: Diagnosis not present

## 2021-09-23 DIAGNOSIS — Z9889 Other specified postprocedural states: Secondary | ICD-10-CM | POA: Diagnosis not present

## 2021-09-23 DIAGNOSIS — E875 Hyperkalemia: Secondary | ICD-10-CM | POA: Diagnosis not present

## 2021-09-23 DIAGNOSIS — Z23 Encounter for immunization: Secondary | ICD-10-CM | POA: Diagnosis not present

## 2021-09-23 DIAGNOSIS — Z8546 Personal history of malignant neoplasm of prostate: Secondary | ICD-10-CM | POA: Diagnosis not present

## 2021-10-07 ENCOUNTER — Encounter: Payer: Self-pay | Admitting: Internal Medicine

## 2021-10-07 ENCOUNTER — Ambulatory Visit: Payer: Medicare HMO | Admitting: Internal Medicine

## 2021-10-07 ENCOUNTER — Other Ambulatory Visit: Payer: Self-pay

## 2021-10-07 VITALS — BP 130/78 | HR 84 | Temp 98.0°F | Ht 72.0 in | Wt 137.1 lb

## 2021-10-07 DIAGNOSIS — M62830 Muscle spasm of back: Secondary | ICD-10-CM | POA: Diagnosis not present

## 2021-10-07 DIAGNOSIS — M9903 Segmental and somatic dysfunction of lumbar region: Secondary | ICD-10-CM | POA: Diagnosis not present

## 2021-10-07 DIAGNOSIS — M25551 Pain in right hip: Secondary | ICD-10-CM | POA: Diagnosis not present

## 2021-10-07 DIAGNOSIS — Z23 Encounter for immunization: Secondary | ICD-10-CM | POA: Diagnosis not present

## 2021-10-07 DIAGNOSIS — R0609 Other forms of dyspnea: Secondary | ICD-10-CM | POA: Insufficient documentation

## 2021-10-07 DIAGNOSIS — M9902 Segmental and somatic dysfunction of thoracic region: Secondary | ICD-10-CM | POA: Diagnosis not present

## 2021-10-07 DIAGNOSIS — M9905 Segmental and somatic dysfunction of pelvic region: Secondary | ICD-10-CM | POA: Diagnosis not present

## 2021-10-07 NOTE — Progress Notes (Signed)
Dominic Oliver, male    DOB: 1951-04-23,   MRN: [id number]   Brief patient profile:  26 yowm quit smoking 2000 never really regular smoker and did fine until covid 19  Sept 2021 > rx inhalers helped some initially then seemed worse so referred to pulmonary clinic in [place]  10/07/2021 by Dr  Delphina Cahill   2 d  onset of covid p climbed steps passed out    History of Present Illness  10/07/2021  Pulmonary/ 1st office eval/ Christina Waldrop / [place] [facility]  Chief Complaint  Patient presents with   Consult    Trouble getting a full deep breath. CXR done on 6/22 compared to 12/21 was concerning to pcp. PCP referred based off CXR. PFT and Chest CT  done in August  2022  Dyspnea:  never at rest, variable with activity, sometimes or rising  Cough: none  Sleep: fine flat lying down sleeps about 5 hours nl for him  SABA use: not at all   No obvious day to day or daytime variability or assoc excess/ purulent sputum or mucus plugs or hemoptysis or cp or chest tightness, subjective wheeze or overt sinus or hb symptoms.   sleeping without nocturnal  or early am exacerbation  of respiratory  c/o's or need for noct saba. Also denies any obvious fluctuation of symptoms with weather or environmental changes or other aggravating or alleviating factors except as outlined above   No unusual exposure hx or h/o childhood pna/ asthma or knowledge of premature birth.  Current Allergies, Complete Past Medical History, Past Surgical History, Family History, and Social History were reviewed in [employer] record.  ROS  The following are not active complaints unless bolded Hoarseness, sore throat, dysphagia, dental problems, itching, sneezing,  nasal congestion or discharge of excess mucus or purulent secretions, ear ache,   fever, chills, sweats, unintended wt loss or wt gain, classically pleuritic or exertional cp,  orthopnea pnd or arm/hand swelling  or leg swelling, presyncope, palpitations,  abdominal pain, anorexia, nausea, vomiting, diarrhea  or change in bowel habits or change in bladder habits, change in stools or change in urine, dysuria, hematuria,  rash, arthralgias, visual complaints, headache, numbness, weakness or ataxia or problems with walking or coordination,  change in mood or  memory.           Past Medical History:  Diagnosis Date   Arthritis    knees, spine, thumbs   Diverticulitis    GERD (gastroesophageal reflux disease)    HLD (hyperlipidemia) 03/26/2017   Peptic ulcer disease    Prostate cancer ([place])    prostate   Restless leg syndrome    amitripytline helps    Outpatient Medications Prior to Visit  Medication Sig Dispense Refill   albuterol (VENTOLIN HFA) 108 (90 Base) MCG/ACT inhaler Inhale 1-2 puffs into the lungs every 6 (six) hours as needed for wheezing or shortness of breath. 18 g 0   cholecalciferol (VITAMIN D) 1000 units tablet Take 2,000 Units by mouth daily.     Cyanocobalamin (VITAMIN B 12 PO) Take by mouth.     cyclobenzaprine (FLEXERIL) 5 MG tablet Take 1 tablet (5 mg total) by mouth 3 (three) times daily as needed for muscle spasms. 30 tablet 0   ibuprofen (ADVIL,MOTRIN) 200 MG tablet Take 400 mg by mouth once as needed for headache or mild pain.     KRILL OIL PO Take by mouth.     traZODone (DESYREL) 50 MG tablet Take  50 mg by mouth at bedtime.     No facility-administered medications prior to visit.     Objective:     BP 130/78 (BP Location: Left Arm, Patient Position: Sitting)   Pulse 84   Temp 98 F (36.7 C) (Temporal)   Ht 6' (1.829 m)   Wt 137 lb 1.9 oz (62.2 kg)   SpO2 98% Comment: ra  BMI 18.60 kg/m   SpO2: 98 % (ra)    General appearance:     Amb sober but fit appearing wm nad   HEENT : pt wearing mask not removed for exam due to covid -19 concerns.    NECK :  without JVD/Nodes/TM/ nl carotid upstrokes bilaterally   LUNGS: no acc muscle use,  Nl contour chest which is clear to A and P bilaterally without  cough on insp or exp maneuvers   CV:  RRR  no s3 or murmur or increase in P2, and no edema   ABD:  soft and nontender with nl inspiratory excursion in the supine position. No bruits or organomegaly appreciated, bowel sounds nl  MS:  Nl gait/ ext warm without deformities, calf tenderness, cyanosis or clubbing No obvious joint restrictions   SKIN: warm and dry without lesions    NEURO:  alert, approp, nl sensorium with  no motor or cerebellar deficits apparent.     I personally reviewed images and agree with radiology impression as follows:   Chest CT w/o contrast 08/14/21  1. 6 mm noncalcified right apical lung nodule versus focal scar. Non-contrast chest CT at 6-12 months is recommended. If the nodule is stable at time of repeat CT, then future CT at 18-24 months (from today's scan) is considered optional for low-risk patients, but is recommended for high-risk patients. (Pt out > 15 y so relatively low risk, f/u optional)      Assessment   DOE (dyspnea on exertion) Onset with covid 19 Sept 2021  - PFTs  08/12/21  wnl  - 10/07/2021   Walked on RA x  3   lap(s) =  approx 450 ft @ very fast pace, stopped due to end of study  with lowest 02 sats 97% so rec sub max ex x 2 weeks and call if not improving for cpst     Symptoms are markedly disproportionate to objective findings and not clear to what extent this is actually a pulmonary  problem but pt does appear to have difficult to sort out respiratory symptoms of unknown origin for which  DDX  = almost all start with A and  include Adherence, Ace Inhibitors, Acid Reflux, Active Sinus Disease, Alpha 1 Antitripsin deficiency, Anxiety masquerading as Airways dz,  ABPA,  Allergy(esp in young), Aspiration (esp in elderly), Adverse effects of meds,  Active smoking or Vaping, A bunch of PE's/clot burden (a few small clots can't cause this syndrome unless there is already severe underlying pulm or vascular dz with poor reserve),  Anemia or thyroid  disorder, plus two Bs  = Bronchiectasis and Beta blocker use..and one C= CHF     Adherence is always the initial "prime suspect" and is a multilayered concern that requires a "trust but verify" approach in every patient - starting with knowing how to use medications, especially inhalers, correctly, keeping up with refills and understanding the fundamental difference between maintenance and prns vs those medications only taken for a very short course and then stopped and not refilled.   ? Anxiety/depression/ deconditioning > usually at the bottom  of this list of usual suspects but note already on psychotropics and may interfere with adherence and also interpretation of response or lack thereof to symptom management which can be quite subjective> rx per PCP - sub max ex and f/u with cpst next   Allergy /asthma > check profile > labs not done  Anemia/ thyroid dz > labs not done  A bunch of PE's > check d dimer > labs not done  ? Chf/ diastolic dysfunction > labs not done   >>>  Will request pt return for labs prior to considering cpst    Each maintenance medication was reviewed in detail including emphasizing most importantly the difference between maintenance and prns and under what circumstances the prns are to be triggered using an action plan format where appropriate.  Total time for H and P, chart review, counseling,  directly observing portions of ambulatory 02 saturation study/ and generating customized AVS unique to this office visit / same day charting = 50 min                   Christinia Gully, MD 10/07/2021

## 2021-10-07 NOTE — Assessment & Plan Note (Addendum)
Onset with covid 19 Sept 2021  - PFTs  08/12/21  wnl  - 10/07/2021   Walked on RA x  3   lap(s) =  approx 450 ft @ very fast pace, stopped due to end of study  with lowest 02 sats 97% so rec sub max ex x 2 weeks and call if not improving for cpst     Symptoms are markedly disproportionate to objective findings and not clear to what extent this is actually a pulmonary  problem but pt does appear to have difficult to sort out respiratory symptoms of unknown origin for which  DDX  = almost all start with A and  include Adherence, Ace Inhibitors, Acid Reflux, Active Sinus Disease, Alpha 1 Antitripsin deficiency, Anxiety masquerading as Airways dz,  ABPA,  Allergy(esp in young), Aspiration (esp in elderly), Adverse effects of meds,  Active smoking or Vaping, A bunch of PE's/clot burden (a few small clots can't cause this syndrome unless there is already severe underlying pulm or vascular dz with poor reserve),  Anemia or thyroid disorder, plus two Bs  = Bronchiectasis and Beta blocker use..and one C= CHF     Adherence is always the initial "prime suspect" and is a multilayered concern that requires a "trust but verify" approach in every patient - starting with knowing how to use medications, especially inhalers, correctly, keeping up with refills and understanding the fundamental difference between maintenance and prns vs those medications only taken for a very short course and then stopped and not refilled.   ? Anxiety/depression/ deconditioning > usually at the bottom of this list of usual suspects but note already on psychotropics and may interfere with adherence and also interpretation of response or lack thereof to symptom management which can be quite subjective> rx per PCP - sub max ex and f/u with cpst next   Allergy /asthma > check profile > labs not done  Anemia/ thyroid dz > labs not done  A bunch of PE's > check d dimer > labs not done  ? Chf/ diastolic dysfunction > labs not done   >>>   Will request pt return for labs prior to considering cpst    Each maintenance medication was reviewed in detail including emphasizing most importantly the difference between maintenance and prns and under what circumstances the prns are to be triggered using an action plan format where appropriate.  Total time for H and P, chart review, counseling,  directly observing portions of ambulatory 02 saturation study/ and generating customized AVS unique to this office visit / same day charting = 50 min

## 2021-10-07 NOTE — Patient Instructions (Addendum)
To get the most out of exercise, you need to be continuously aware that you are short of breath, but never out of breath, for at least 30 minutes daily. As you improve, it will actually be easier for you to do the same amount of exercise  in  30 minutes so always push to the level where you are short of breath.     Make sure you check your oxygen saturations at highest level of activity should stay well over 90%   Please remember to go to the lab department   for your tests - we will call you with the results when they are available.      Call if not improving after several weeks so we can schedule you a CPST in our [place] office

## 2021-10-12 ENCOUNTER — Encounter: Payer: Self-pay | Admitting: Internal Medicine

## 2021-10-14 LAB — CBC WITH DIFFERENTIAL/PLATELET
Basophils Absolute: 0 10*3/uL (ref 0.0–0.2)
Basos: 1 %
EOS (ABSOLUTE): 0.1 10*3/uL (ref 0.0–0.4)
Eos: 2 %
Hematocrit: 44.1 % (ref 37.5–51.0)
Hemoglobin: 14.9 g/dL (ref 13.0–17.7)
Immature Grans (Abs): 0 10*3/uL (ref 0.0–0.1)
Immature Granulocytes: 0 %
Lymphocytes Absolute: 1.2 10*3/uL (ref 0.7–3.1)
Lymphs: 22 %
MCH: 31.6 pg (ref 26.6–33.0)
MCHC: 33.8 g/dL (ref 31.5–35.7)
MCV: 94 fL (ref 79–97)
Monocytes Absolute: 0.4 10*3/uL (ref 0.1–0.9)
Monocytes: 8 %
Neutrophils Absolute: 3.9 10*3/uL (ref 1.4–7.0)
Neutrophils: 67 %
Platelets: 269 10*3/uL (ref 150–450)
RBC: 4.71 x10E6/uL (ref 4.14–5.80)
RDW: 12.2 % (ref 11.6–15.4)
WBC: 5.7 10*3/uL (ref 3.4–10.8)

## 2021-10-14 LAB — SEDIMENTATION RATE: Sed Rate: 2 mm/hr (ref 0–30)

## 2021-10-14 LAB — IGE: IgE (Immunoglobulin E), Serum: 40 IU/mL (ref 6–495)

## 2021-10-14 LAB — D-DIMER, QUANTITATIVE: D-DIMER: 0.26 mg/L FEU (ref 0.00–0.49)

## 2021-10-14 LAB — BRAIN NATRIURETIC PEPTIDE: BNP: 14.6 pg/mL (ref 0.0–100.0)

## 2021-10-14 LAB — TSH: TSH: 2.01 u[IU]/mL (ref 0.450–4.500)

## 2021-10-16 DIAGNOSIS — M62830 Muscle spasm of back: Secondary | ICD-10-CM | POA: Diagnosis not present

## 2021-10-16 DIAGNOSIS — M9902 Segmental and somatic dysfunction of thoracic region: Secondary | ICD-10-CM | POA: Diagnosis not present

## 2021-10-16 DIAGNOSIS — M9903 Segmental and somatic dysfunction of lumbar region: Secondary | ICD-10-CM | POA: Diagnosis not present

## 2021-10-16 DIAGNOSIS — M9905 Segmental and somatic dysfunction of pelvic region: Secondary | ICD-10-CM | POA: Diagnosis not present

## 2021-10-16 DIAGNOSIS — M25551 Pain in right hip: Secondary | ICD-10-CM | POA: Diagnosis not present

## 2021-10-23 DIAGNOSIS — M9902 Segmental and somatic dysfunction of thoracic region: Secondary | ICD-10-CM | POA: Diagnosis not present

## 2021-10-23 DIAGNOSIS — M9903 Segmental and somatic dysfunction of lumbar region: Secondary | ICD-10-CM | POA: Diagnosis not present

## 2021-10-23 DIAGNOSIS — M9905 Segmental and somatic dysfunction of pelvic region: Secondary | ICD-10-CM | POA: Diagnosis not present

## 2021-10-23 DIAGNOSIS — M25551 Pain in right hip: Secondary | ICD-10-CM | POA: Diagnosis not present

## 2021-10-23 DIAGNOSIS — M62830 Muscle spasm of back: Secondary | ICD-10-CM | POA: Diagnosis not present

## 2021-10-30 IMAGING — CT CT CHEST W/O CM
2 of 4 series · 15 of 36 positions shown, 18 images · non-contrast
Comparison: March 17, 2013

CLINICAL DATA: Shortness of breath on exertion x2 months.

EXAM:
CT CHEST WITHOUT CONTRAST
TECHNIQUE: Multidetector CT imaging of the chest was performed following the
standard protocol without IV contrast.

[Series 2: routine chest without · axial · non-contrast · 0.70mm/px · z∈[+1114,+1422]mm · 12 of 182 slices shown, 15 images]
[im 14/182  mediastinal]
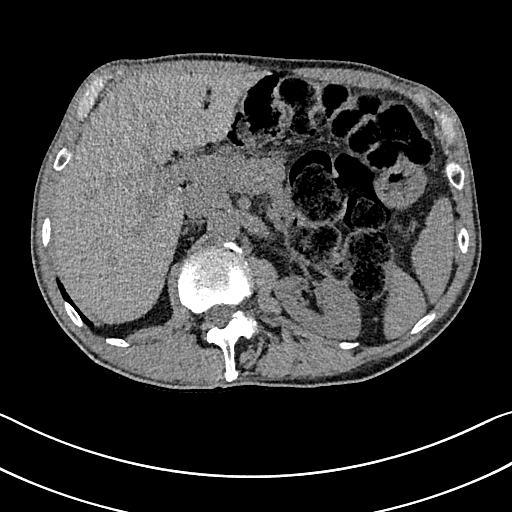
[im 14/182  lung]
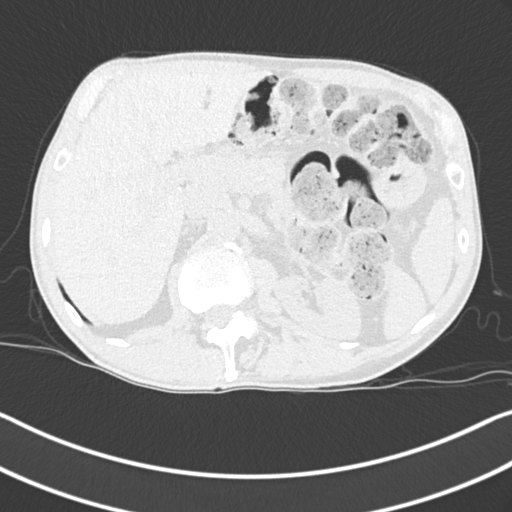
[im 28/182  lung]
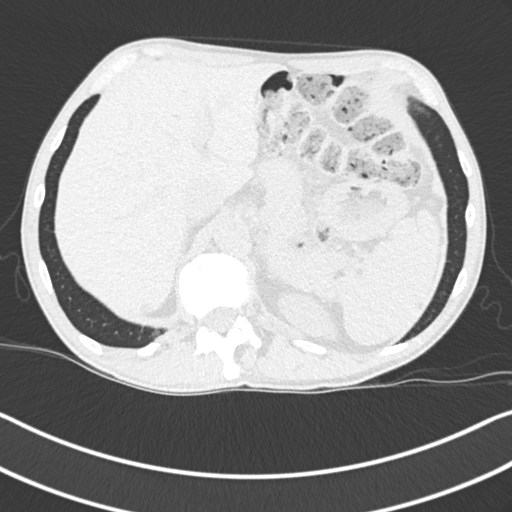
[im 42/182  lung]
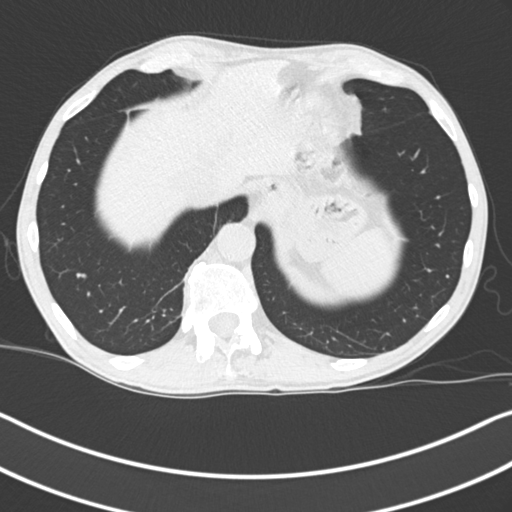
[im 56/182  lung]
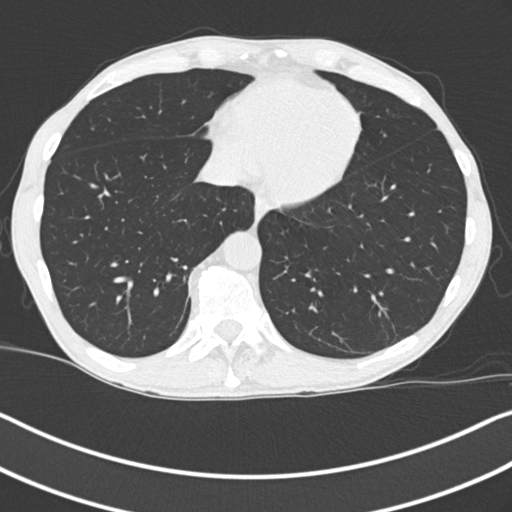
[im 70/182  mediastinal]
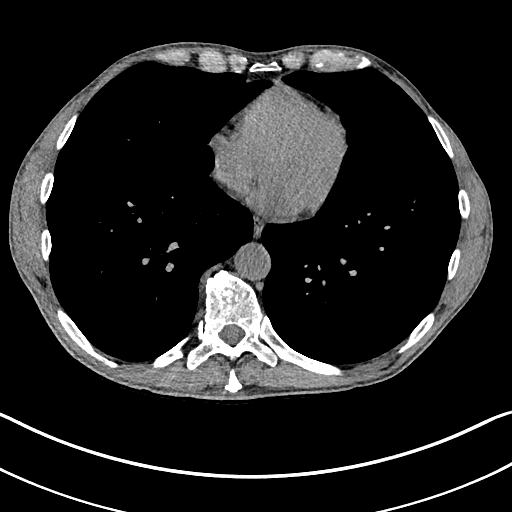
[im 70/182  lung]
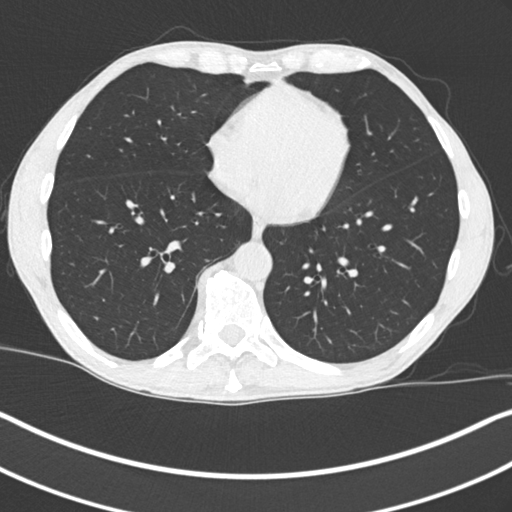
[im 84/182  lung]
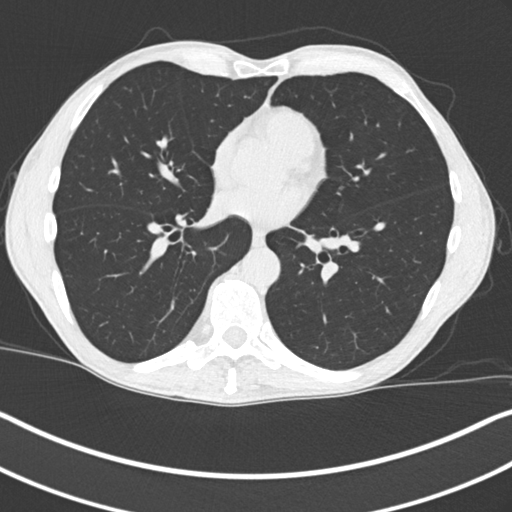
[im 98/182  lung]
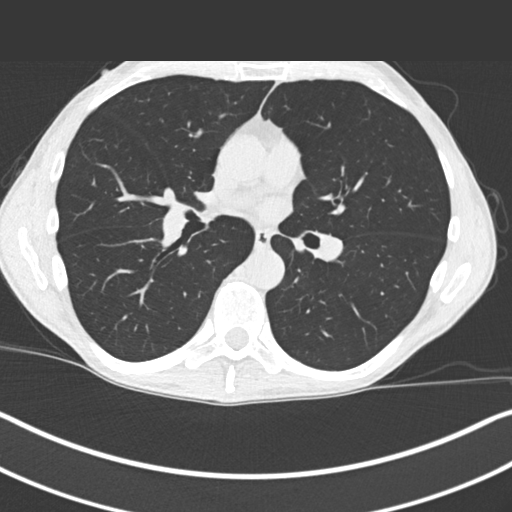
[im 112/182  lung]
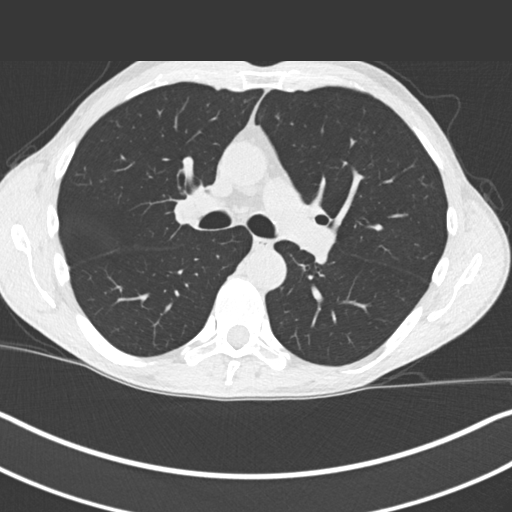
[im 126/182  mediastinal]
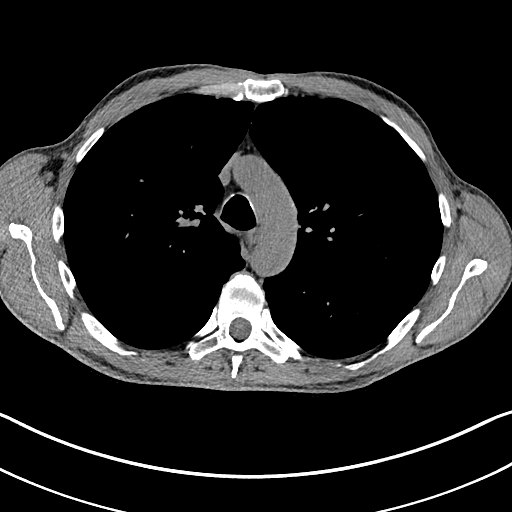
[im 126/182  lung]
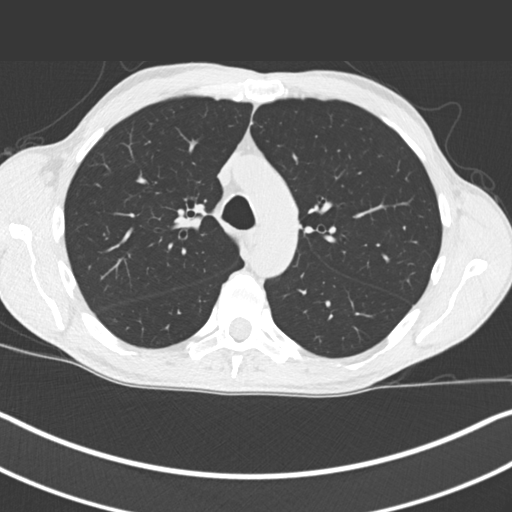
[im 140/182  lung]
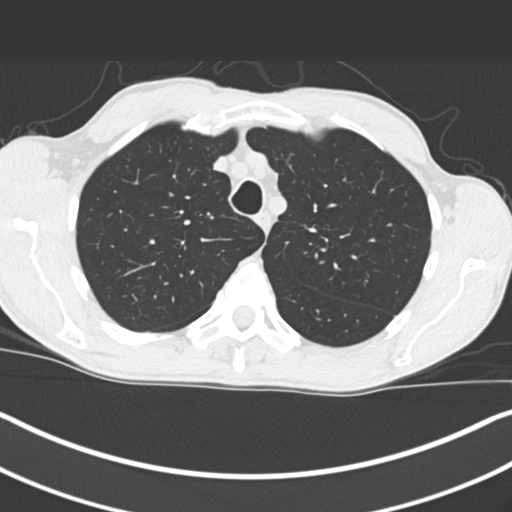
[im 154/182  lung]
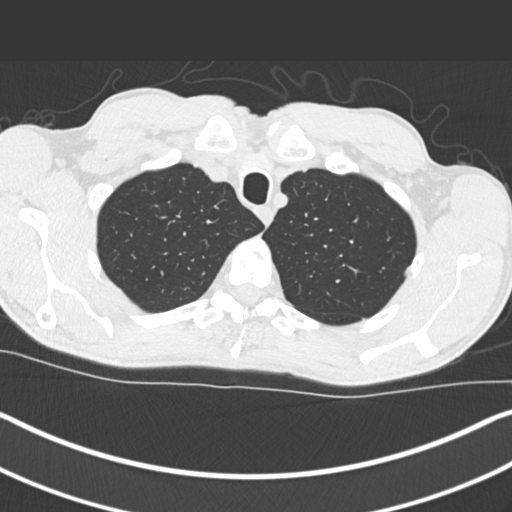
[im 168/182  lung]
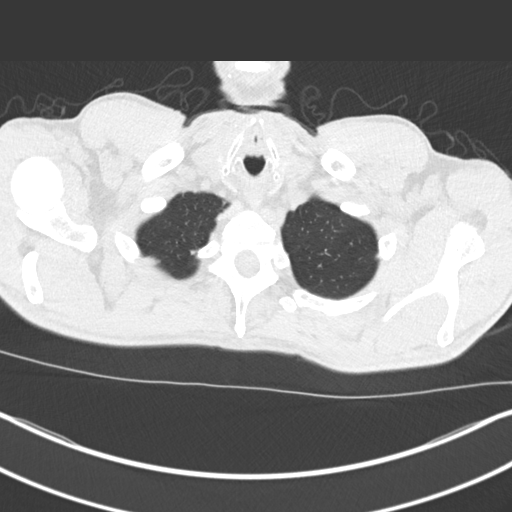

[Series 5: coronal · coronal · 0.63mm/px · 3 of 137 slices shown]
[im 28/137  lung]
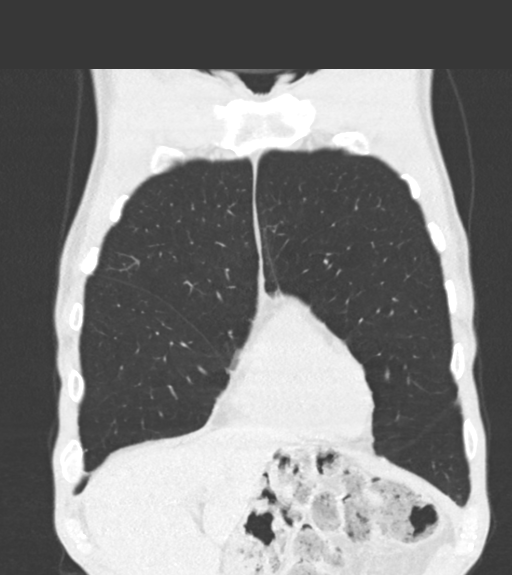
[im 55/137  lung]
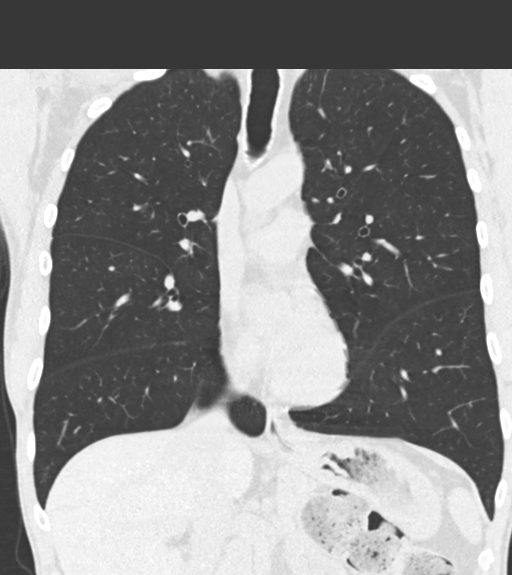
[im 82/137  lung]
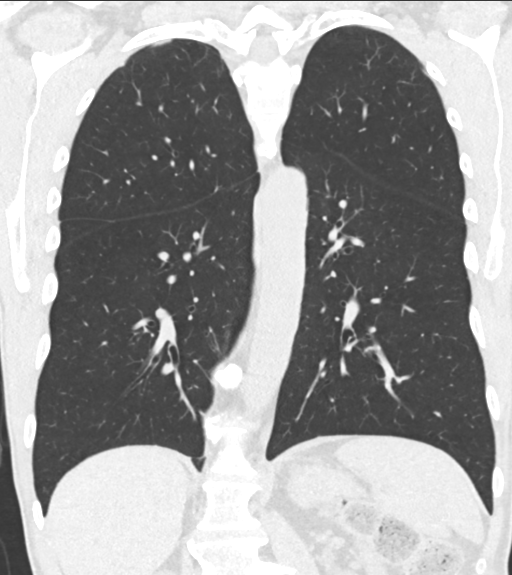

[15 of 36 positions shown; findings below may reference images not displayed]

FINDINGS: Cardiovascular: There is mild calcification of the aortic arch
without evidence of aortic aneurysm. Normal heart size. No
pericardial effusion.

Mediastinum/Nodes: No enlarged mediastinal or axillary lymph nodes.
Thyroid gland, trachea, and esophagus demonstrate no significant
findings.

Lungs/Pleura: Mild right apical scarring and/or atelectasis is seen.

A 6 mm noncalcified lung nodule versus focal scar is noted within
the posteromedial aspect of the right apex. (Axial CT image 16, CT
series 4).

No pleural effusion or pneumothorax.

Upper Abdomen: A tiny gallstone is seen within the gallbladder
lumen.

Musculoskeletal: Degenerative changes are noted within the thoracic
spine.
IMPRESSION: 1. 6 mm noncalcified right apical lung nodule versus focal scar.
Non-contrast chest CT at 6-12 months is recommended. If the nodule
is stable at time of repeat CT, then future CT at 18-24 months (from
today's scan) is considered optional for low-risk patients, but is
recommended for high-risk patients. This recommendation follows the
consensus statement: Guidelines for Management of Incidental
Pulmonary Nodules Detected on CT Images: From the [HOSPITAL]
2. Cholelithiasis.

## 2021-11-13 DIAGNOSIS — M9903 Segmental and somatic dysfunction of lumbar region: Secondary | ICD-10-CM | POA: Diagnosis not present

## 2021-11-13 DIAGNOSIS — M9902 Segmental and somatic dysfunction of thoracic region: Secondary | ICD-10-CM | POA: Diagnosis not present

## 2021-11-13 DIAGNOSIS — M62830 Muscle spasm of back: Secondary | ICD-10-CM | POA: Diagnosis not present

## 2021-11-13 DIAGNOSIS — M9905 Segmental and somatic dysfunction of pelvic region: Secondary | ICD-10-CM | POA: Diagnosis not present

## 2021-11-13 DIAGNOSIS — M25551 Pain in right hip: Secondary | ICD-10-CM | POA: Diagnosis not present

## 2021-12-16 DIAGNOSIS — M62830 Muscle spasm of back: Secondary | ICD-10-CM | POA: Diagnosis not present

## 2021-12-16 DIAGNOSIS — M9902 Segmental and somatic dysfunction of thoracic region: Secondary | ICD-10-CM | POA: Diagnosis not present

## 2021-12-16 DIAGNOSIS — M9905 Segmental and somatic dysfunction of pelvic region: Secondary | ICD-10-CM | POA: Diagnosis not present

## 2021-12-16 DIAGNOSIS — M25551 Pain in right hip: Secondary | ICD-10-CM | POA: Diagnosis not present

## 2021-12-16 DIAGNOSIS — M9903 Segmental and somatic dysfunction of lumbar region: Secondary | ICD-10-CM | POA: Diagnosis not present

## 2022-01-02 DIAGNOSIS — H25811 Combined forms of age-related cataract, right eye: Secondary | ICD-10-CM | POA: Diagnosis not present

## 2022-01-02 DIAGNOSIS — H25812 Combined forms of age-related cataract, left eye: Secondary | ICD-10-CM | POA: Diagnosis not present

## 2022-01-02 DIAGNOSIS — H25013 Cortical age-related cataract, bilateral: Secondary | ICD-10-CM | POA: Diagnosis not present

## 2022-01-15 DIAGNOSIS — M9903 Segmental and somatic dysfunction of lumbar region: Secondary | ICD-10-CM | POA: Diagnosis not present

## 2022-01-15 DIAGNOSIS — M62830 Muscle spasm of back: Secondary | ICD-10-CM | POA: Diagnosis not present

## 2022-01-15 DIAGNOSIS — M25551 Pain in right hip: Secondary | ICD-10-CM | POA: Diagnosis not present

## 2022-01-15 DIAGNOSIS — M9902 Segmental and somatic dysfunction of thoracic region: Secondary | ICD-10-CM | POA: Diagnosis not present

## 2022-01-15 DIAGNOSIS — M9905 Segmental and somatic dysfunction of pelvic region: Secondary | ICD-10-CM | POA: Diagnosis not present

## 2022-01-17 DIAGNOSIS — M9903 Segmental and somatic dysfunction of lumbar region: Secondary | ICD-10-CM | POA: Diagnosis not present

## 2022-01-17 DIAGNOSIS — M9902 Segmental and somatic dysfunction of thoracic region: Secondary | ICD-10-CM | POA: Diagnosis not present

## 2022-01-17 DIAGNOSIS — M62830 Muscle spasm of back: Secondary | ICD-10-CM | POA: Diagnosis not present

## 2022-01-17 DIAGNOSIS — M25551 Pain in right hip: Secondary | ICD-10-CM | POA: Diagnosis not present

## 2022-01-17 DIAGNOSIS — M9905 Segmental and somatic dysfunction of pelvic region: Secondary | ICD-10-CM | POA: Diagnosis not present

## 2022-01-22 DIAGNOSIS — M9903 Segmental and somatic dysfunction of lumbar region: Secondary | ICD-10-CM | POA: Diagnosis not present

## 2022-01-22 DIAGNOSIS — M25551 Pain in right hip: Secondary | ICD-10-CM | POA: Diagnosis not present

## 2022-01-22 DIAGNOSIS — M9905 Segmental and somatic dysfunction of pelvic region: Secondary | ICD-10-CM | POA: Diagnosis not present

## 2022-01-22 DIAGNOSIS — M9902 Segmental and somatic dysfunction of thoracic region: Secondary | ICD-10-CM | POA: Diagnosis not present

## 2022-01-22 DIAGNOSIS — M62830 Muscle spasm of back: Secondary | ICD-10-CM | POA: Diagnosis not present

## 2022-01-24 DIAGNOSIS — M9903 Segmental and somatic dysfunction of lumbar region: Secondary | ICD-10-CM | POA: Diagnosis not present

## 2022-01-24 DIAGNOSIS — M9902 Segmental and somatic dysfunction of thoracic region: Secondary | ICD-10-CM | POA: Diagnosis not present

## 2022-01-24 DIAGNOSIS — M62830 Muscle spasm of back: Secondary | ICD-10-CM | POA: Diagnosis not present

## 2022-01-24 DIAGNOSIS — M25551 Pain in right hip: Secondary | ICD-10-CM | POA: Diagnosis not present

## 2022-01-24 DIAGNOSIS — M9905 Segmental and somatic dysfunction of pelvic region: Secondary | ICD-10-CM | POA: Diagnosis not present

## 2022-01-29 DIAGNOSIS — M62830 Muscle spasm of back: Secondary | ICD-10-CM | POA: Diagnosis not present

## 2022-01-29 DIAGNOSIS — M9902 Segmental and somatic dysfunction of thoracic region: Secondary | ICD-10-CM | POA: Diagnosis not present

## 2022-01-29 DIAGNOSIS — M9903 Segmental and somatic dysfunction of lumbar region: Secondary | ICD-10-CM | POA: Diagnosis not present

## 2022-01-29 DIAGNOSIS — M9905 Segmental and somatic dysfunction of pelvic region: Secondary | ICD-10-CM | POA: Diagnosis not present

## 2022-01-29 DIAGNOSIS — M25551 Pain in right hip: Secondary | ICD-10-CM | POA: Diagnosis not present

## 2022-02-03 DIAGNOSIS — M25551 Pain in right hip: Secondary | ICD-10-CM | POA: Diagnosis not present

## 2022-02-03 DIAGNOSIS — M9902 Segmental and somatic dysfunction of thoracic region: Secondary | ICD-10-CM | POA: Diagnosis not present

## 2022-02-03 DIAGNOSIS — M62830 Muscle spasm of back: Secondary | ICD-10-CM | POA: Diagnosis not present

## 2022-02-03 DIAGNOSIS — M9903 Segmental and somatic dysfunction of lumbar region: Secondary | ICD-10-CM | POA: Diagnosis not present

## 2022-02-03 DIAGNOSIS — M9905 Segmental and somatic dysfunction of pelvic region: Secondary | ICD-10-CM | POA: Diagnosis not present

## 2022-02-05 DIAGNOSIS — M62830 Muscle spasm of back: Secondary | ICD-10-CM | POA: Diagnosis not present

## 2022-02-05 DIAGNOSIS — M9903 Segmental and somatic dysfunction of lumbar region: Secondary | ICD-10-CM | POA: Diagnosis not present

## 2022-02-05 DIAGNOSIS — M9905 Segmental and somatic dysfunction of pelvic region: Secondary | ICD-10-CM | POA: Diagnosis not present

## 2022-02-05 DIAGNOSIS — M25551 Pain in right hip: Secondary | ICD-10-CM | POA: Diagnosis not present

## 2022-02-05 DIAGNOSIS — M9902 Segmental and somatic dysfunction of thoracic region: Secondary | ICD-10-CM | POA: Diagnosis not present

## 2022-02-10 DIAGNOSIS — M25551 Pain in right hip: Secondary | ICD-10-CM | POA: Diagnosis not present

## 2022-02-10 DIAGNOSIS — M9902 Segmental and somatic dysfunction of thoracic region: Secondary | ICD-10-CM | POA: Diagnosis not present

## 2022-02-10 DIAGNOSIS — M9905 Segmental and somatic dysfunction of pelvic region: Secondary | ICD-10-CM | POA: Diagnosis not present

## 2022-02-10 DIAGNOSIS — M9903 Segmental and somatic dysfunction of lumbar region: Secondary | ICD-10-CM | POA: Diagnosis not present

## 2022-02-10 DIAGNOSIS — M62830 Muscle spasm of back: Secondary | ICD-10-CM | POA: Diagnosis not present

## 2022-02-12 DIAGNOSIS — M9905 Segmental and somatic dysfunction of pelvic region: Secondary | ICD-10-CM | POA: Diagnosis not present

## 2022-02-12 DIAGNOSIS — M62830 Muscle spasm of back: Secondary | ICD-10-CM | POA: Diagnosis not present

## 2022-02-12 DIAGNOSIS — M25551 Pain in right hip: Secondary | ICD-10-CM | POA: Diagnosis not present

## 2022-02-12 DIAGNOSIS — M9903 Segmental and somatic dysfunction of lumbar region: Secondary | ICD-10-CM | POA: Diagnosis not present

## 2022-02-12 DIAGNOSIS — M9902 Segmental and somatic dysfunction of thoracic region: Secondary | ICD-10-CM | POA: Diagnosis not present

## 2022-02-28 DIAGNOSIS — M9902 Segmental and somatic dysfunction of thoracic region: Secondary | ICD-10-CM | POA: Diagnosis not present

## 2022-02-28 DIAGNOSIS — M9905 Segmental and somatic dysfunction of pelvic region: Secondary | ICD-10-CM | POA: Diagnosis not present

## 2022-02-28 DIAGNOSIS — M9903 Segmental and somatic dysfunction of lumbar region: Secondary | ICD-10-CM | POA: Diagnosis not present

## 2022-02-28 DIAGNOSIS — M62830 Muscle spasm of back: Secondary | ICD-10-CM | POA: Diagnosis not present

## 2022-02-28 DIAGNOSIS — M25551 Pain in right hip: Secondary | ICD-10-CM | POA: Diagnosis not present

## 2022-03-20 DIAGNOSIS — E782 Mixed hyperlipidemia: Secondary | ICD-10-CM | POA: Diagnosis not present

## 2022-03-24 DIAGNOSIS — E782 Mixed hyperlipidemia: Secondary | ICD-10-CM | POA: Diagnosis not present

## 2022-03-24 DIAGNOSIS — G4700 Insomnia, unspecified: Secondary | ICD-10-CM | POA: Diagnosis not present

## 2022-03-24 DIAGNOSIS — R7301 Impaired fasting glucose: Secondary | ICD-10-CM | POA: Diagnosis not present

## 2022-03-24 DIAGNOSIS — M25551 Pain in right hip: Secondary | ICD-10-CM | POA: Diagnosis not present

## 2022-03-24 DIAGNOSIS — Z9889 Other specified postprocedural states: Secondary | ICD-10-CM | POA: Diagnosis not present

## 2022-03-24 DIAGNOSIS — R911 Solitary pulmonary nodule: Secondary | ICD-10-CM | POA: Diagnosis not present

## 2022-03-24 DIAGNOSIS — F17201 Nicotine dependence, unspecified, in remission: Secondary | ICD-10-CM | POA: Diagnosis not present

## 2022-03-24 DIAGNOSIS — S83242S Other tear of medial meniscus, current injury, left knee, sequela: Secondary | ICD-10-CM | POA: Diagnosis not present

## 2022-03-24 DIAGNOSIS — H9193 Unspecified hearing loss, bilateral: Secondary | ICD-10-CM | POA: Diagnosis not present

## 2022-03-24 DIAGNOSIS — R413 Other amnesia: Secondary | ICD-10-CM | POA: Diagnosis not present

## 2022-03-24 DIAGNOSIS — E875 Hyperkalemia: Secondary | ICD-10-CM | POA: Diagnosis not present

## 2022-04-16 DIAGNOSIS — W57XXXA Bitten or stung by nonvenomous insect and other nonvenomous arthropods, initial encounter: Secondary | ICD-10-CM | POA: Diagnosis not present

## 2022-04-16 DIAGNOSIS — J302 Other seasonal allergic rhinitis: Secondary | ICD-10-CM | POA: Diagnosis not present

## 2022-04-28 DIAGNOSIS — M9902 Segmental and somatic dysfunction of thoracic region: Secondary | ICD-10-CM | POA: Diagnosis not present

## 2022-04-28 DIAGNOSIS — M62830 Muscle spasm of back: Secondary | ICD-10-CM | POA: Diagnosis not present

## 2022-04-28 DIAGNOSIS — M9903 Segmental and somatic dysfunction of lumbar region: Secondary | ICD-10-CM | POA: Diagnosis not present

## 2022-04-28 DIAGNOSIS — M9905 Segmental and somatic dysfunction of pelvic region: Secondary | ICD-10-CM | POA: Diagnosis not present

## 2022-04-28 DIAGNOSIS — M25551 Pain in right hip: Secondary | ICD-10-CM | POA: Diagnosis not present

## 2022-05-02 DIAGNOSIS — M9902 Segmental and somatic dysfunction of thoracic region: Secondary | ICD-10-CM | POA: Diagnosis not present

## 2022-05-02 DIAGNOSIS — M9905 Segmental and somatic dysfunction of pelvic region: Secondary | ICD-10-CM | POA: Diagnosis not present

## 2022-05-02 DIAGNOSIS — M9903 Segmental and somatic dysfunction of lumbar region: Secondary | ICD-10-CM | POA: Diagnosis not present

## 2022-05-02 DIAGNOSIS — M25551 Pain in right hip: Secondary | ICD-10-CM | POA: Diagnosis not present

## 2022-05-02 DIAGNOSIS — M62830 Muscle spasm of back: Secondary | ICD-10-CM | POA: Diagnosis not present

## 2022-06-27 DIAGNOSIS — M62830 Muscle spasm of back: Secondary | ICD-10-CM | POA: Diagnosis not present

## 2022-06-27 DIAGNOSIS — M9905 Segmental and somatic dysfunction of pelvic region: Secondary | ICD-10-CM | POA: Diagnosis not present

## 2022-06-27 DIAGNOSIS — M9903 Segmental and somatic dysfunction of lumbar region: Secondary | ICD-10-CM | POA: Diagnosis not present

## 2022-06-27 DIAGNOSIS — M9902 Segmental and somatic dysfunction of thoracic region: Secondary | ICD-10-CM | POA: Diagnosis not present

## 2022-06-27 DIAGNOSIS — M25551 Pain in right hip: Secondary | ICD-10-CM | POA: Diagnosis not present

## 2022-06-30 DIAGNOSIS — M9903 Segmental and somatic dysfunction of lumbar region: Secondary | ICD-10-CM | POA: Diagnosis not present

## 2022-06-30 DIAGNOSIS — M62830 Muscle spasm of back: Secondary | ICD-10-CM | POA: Diagnosis not present

## 2022-06-30 DIAGNOSIS — M25551 Pain in right hip: Secondary | ICD-10-CM | POA: Diagnosis not present

## 2022-06-30 DIAGNOSIS — M9905 Segmental and somatic dysfunction of pelvic region: Secondary | ICD-10-CM | POA: Diagnosis not present

## 2022-06-30 DIAGNOSIS — M9902 Segmental and somatic dysfunction of thoracic region: Secondary | ICD-10-CM | POA: Diagnosis not present

## 2022-07-04 DIAGNOSIS — M25551 Pain in right hip: Secondary | ICD-10-CM | POA: Diagnosis not present

## 2022-07-04 DIAGNOSIS — M9902 Segmental and somatic dysfunction of thoracic region: Secondary | ICD-10-CM | POA: Diagnosis not present

## 2022-07-04 DIAGNOSIS — M9903 Segmental and somatic dysfunction of lumbar region: Secondary | ICD-10-CM | POA: Diagnosis not present

## 2022-07-04 DIAGNOSIS — M62830 Muscle spasm of back: Secondary | ICD-10-CM | POA: Diagnosis not present

## 2022-07-04 DIAGNOSIS — M9905 Segmental and somatic dysfunction of pelvic region: Secondary | ICD-10-CM | POA: Diagnosis not present

## 2022-07-07 DIAGNOSIS — M9902 Segmental and somatic dysfunction of thoracic region: Secondary | ICD-10-CM | POA: Diagnosis not present

## 2022-07-07 DIAGNOSIS — M25551 Pain in right hip: Secondary | ICD-10-CM | POA: Diagnosis not present

## 2022-07-07 DIAGNOSIS — M9903 Segmental and somatic dysfunction of lumbar region: Secondary | ICD-10-CM | POA: Diagnosis not present

## 2022-07-07 DIAGNOSIS — M62830 Muscle spasm of back: Secondary | ICD-10-CM | POA: Diagnosis not present

## 2022-07-07 DIAGNOSIS — M9905 Segmental and somatic dysfunction of pelvic region: Secondary | ICD-10-CM | POA: Diagnosis not present

## 2022-07-09 DIAGNOSIS — M25551 Pain in right hip: Secondary | ICD-10-CM | POA: Diagnosis not present

## 2022-07-09 DIAGNOSIS — M9903 Segmental and somatic dysfunction of lumbar region: Secondary | ICD-10-CM | POA: Diagnosis not present

## 2022-07-09 DIAGNOSIS — M9905 Segmental and somatic dysfunction of pelvic region: Secondary | ICD-10-CM | POA: Diagnosis not present

## 2022-07-09 DIAGNOSIS — M62830 Muscle spasm of back: Secondary | ICD-10-CM | POA: Diagnosis not present

## 2022-07-09 DIAGNOSIS — M9902 Segmental and somatic dysfunction of thoracic region: Secondary | ICD-10-CM | POA: Diagnosis not present

## 2022-08-25 ENCOUNTER — Other Ambulatory Visit (HOSPITAL_COMMUNITY): Payer: Self-pay | Admitting: Internal Medicine

## 2022-08-25 ENCOUNTER — Other Ambulatory Visit: Payer: Self-pay | Admitting: Internal Medicine

## 2022-08-25 DIAGNOSIS — R911 Solitary pulmonary nodule: Secondary | ICD-10-CM

## 2022-09-16 ENCOUNTER — Ambulatory Visit (HOSPITAL_COMMUNITY)
Admission: RE | Admit: 2022-09-16 | Discharge: 2022-09-16 | Disposition: A | Discharge status: Home / Self Care | Payer: Medicare Other | Source: Ambulatory Visit | Attending: Internal Medicine | Admitting: Internal Medicine

## 2022-09-16 DIAGNOSIS — R911 Solitary pulmonary nodule: Secondary | ICD-10-CM | POA: Diagnosis not present

## 2022-09-16 DIAGNOSIS — I700 Atherosclerosis of aorta: Secondary | ICD-10-CM | POA: Diagnosis not present

## 2022-09-17 DIAGNOSIS — E782 Mixed hyperlipidemia: Secondary | ICD-10-CM | POA: Diagnosis not present

## 2022-09-17 DIAGNOSIS — R7301 Impaired fasting glucose: Secondary | ICD-10-CM | POA: Diagnosis not present

## 2022-09-24 DIAGNOSIS — S83242S Other tear of medial meniscus, current injury, left knee, sequela: Secondary | ICD-10-CM | POA: Diagnosis not present

## 2022-09-24 DIAGNOSIS — M25551 Pain in right hip: Secondary | ICD-10-CM | POA: Diagnosis not present

## 2022-09-24 DIAGNOSIS — Z9889 Other specified postprocedural states: Secondary | ICD-10-CM | POA: Diagnosis not present

## 2022-09-24 DIAGNOSIS — G4700 Insomnia, unspecified: Secondary | ICD-10-CM | POA: Diagnosis not present

## 2022-09-24 DIAGNOSIS — E782 Mixed hyperlipidemia: Secondary | ICD-10-CM | POA: Diagnosis not present

## 2022-09-24 DIAGNOSIS — Z23 Encounter for immunization: Secondary | ICD-10-CM | POA: Diagnosis not present

## 2022-09-24 DIAGNOSIS — Z0000 Encounter for general adult medical examination without abnormal findings: Secondary | ICD-10-CM | POA: Diagnosis not present

## 2022-09-24 DIAGNOSIS — R413 Other amnesia: Secondary | ICD-10-CM | POA: Diagnosis not present

## 2022-09-24 DIAGNOSIS — H9193 Unspecified hearing loss, bilateral: Secondary | ICD-10-CM | POA: Diagnosis not present

## 2022-09-24 DIAGNOSIS — E875 Hyperkalemia: Secondary | ICD-10-CM | POA: Diagnosis not present

## 2022-09-24 DIAGNOSIS — F17201 Nicotine dependence, unspecified, in remission: Secondary | ICD-10-CM | POA: Diagnosis not present

## 2022-10-16 DIAGNOSIS — H2589 Other age-related cataract: Secondary | ICD-10-CM | POA: Diagnosis not present

## 2022-10-16 DIAGNOSIS — H2513 Age-related nuclear cataract, bilateral: Secondary | ICD-10-CM | POA: Diagnosis not present

## 2022-10-27 ENCOUNTER — Ambulatory Visit
Admission: EM | Admit: 2022-10-27 | Discharge: 2022-10-27 | Disposition: A | Discharge status: Home / Self Care | Payer: Medicare Other | Attending: Family Medicine | Admitting: Family Medicine

## 2022-10-27 ENCOUNTER — Encounter: Payer: Self-pay | Admitting: Emergency Medicine

## 2022-10-27 ENCOUNTER — Other Ambulatory Visit: Payer: Self-pay

## 2022-10-27 DIAGNOSIS — S61012A Laceration without foreign body of left thumb without damage to nail, initial encounter: Secondary | ICD-10-CM | POA: Diagnosis not present

## 2022-10-27 MED ORDER — MUPIROCIN 2 % EX OINT: 1.0000 | TOPICAL_OINTMENT | Freq: Two times a day (BID) | CUTANEOUS | 0 refills | Status: DC

## 2022-10-27 MED ORDER — CHLORHEXIDINE GLUCONATE 4 % EX LIQD: Freq: Every day | CUTANEOUS | 0 refills | Status: DC | PRN

## 2022-10-27 NOTE — ED Notes (Signed)
Site cleaned with wound cleanser. Pt tolerated well.

## 2022-10-27 NOTE — ED Notes (Signed)
Left Thumb laceration dressed with non-adherent pad, reinforced with finger splint and secured with coban. Pt tolerated well.   Site management and infection prevention education provided. Pt and pt family verbalized understanding.

## 2022-10-27 NOTE — ED Provider Notes (Signed)
[facility]    CSN: [id number] Arrival date & time: 10/27/22  1246      History   Chief Complaint Chief Complaint  Patient presents with   Laceration    HPI Dominic Oliver is a 71 y.o. male.   Patient presenting today with a laceration to the left thumb that occurred around 11 AM this morning when he was cutting a pumpkin with a knife and slipped.  States the bleeding has been well controlled with pressure since that occurred.  Cleaned with peroxide before coming here this morning.  Denies decreased range of motion, numbness, tingling to the area.  Last tetanus shot was in 2022.    Past Medical History:  Diagnosis Date   Arthritis    knees, spine, thumbs   Diverticulitis    GERD (gastroesophageal reflux disease)    HLD (hyperlipidemia) 03/26/2017   Peptic ulcer disease    Prostate cancer ([place])    prostate   Restless leg syndrome    amitripytline helps    Patient Active Problem List   Diagnosis Date Noted   DOE (dyspnea on exertion) 10/07/2021   Scoliosis deformity of spine 05/28/2017   HLD (hyperlipidemia) 03/26/2017   RLS (restless legs syndrome) 02/26/2017   GERD (gastroesophageal reflux disease) 02/26/2017   Environmental and seasonal allergies 02/26/2017   Personal history of prostate cancer 79/39/0300   Umbilical hernia 92/33/0076    Past Surgical History:  Procedure Laterality Date   HERNIA REPAIR     INGUINAL HERNIA REPAIR Right 09/29/2014   Procedure: OPEN RIGHT [place];  Surgeon: Gayland Curry, MD;  Location: [facility];  Service: General;  Laterality: Right;   INSERTION OF MESH Right 09/29/2014   Procedure: INSERTION OF MESH;  Surgeon: Gayland Curry, MD;  Location: [facility];  Service: General;  Laterality: Right;   KNEE ARTHROSCOPY  10/2003   LYMPHADENECTOMY Bilateral 03/18/2013   Procedure: LYMPHADENECTOMY;  Surgeon: Alexis Frock, MD;  Location: [facility];  Service: Urology;  Laterality: Bilateral;   NASAL POLYP EXCISION   1989   ROBOT ASSISTED LAPAROSCOPIC RADICAL PROSTATECTOMY N/A 03/18/2013   Procedure: ROBOTIC ASSISTED LAPAROSCOPIC RADICAL PROSTATECTOMY;  Surgeon: Alexis Frock, MD;  Location: [facility];  Service: Urology;  Laterality: N/A;       Home Medications    Prior to Admission medications   Medication Sig Start Date End Date Taking? Authorizing Provider  chlorhexidine (HIBICLENS) 4 % external liquid Apply topically daily as needed. 10/27/22  Yes Volney American, PA-C  mupirocin ointment (BACTROBAN) 2 % Apply 1 Application topically 2 (two) times daily. 10/27/22  Yes Volney American, PA-C  albuterol (VENTOLIN HFA) 108 (90 Base) MCG/ACT inhaler Inhale 1-2 puffs into the lungs every 6 (six) hours as needed for wheezing or shortness of breath. 09/26/20   Avegno, Darrelyn Hillock, FNP  cholecalciferol (VITAMIN D) 1000 units tablet Take 2,000 Units by mouth daily.    [provider]  Cyanocobalamin (VITAMIN B 12 PO) Take by mouth.    [provider]  cyclobenzaprine (FLEXERIL) 5 MG tablet Take 1 tablet (5 mg total) by mouth 3 (three) times daily as needed for muscle spasms. 05/28/17   Raylene Everts, MD  ibuprofen (ADVIL,MOTRIN) 200 MG tablet Take 400 mg by mouth once as needed for headache or mild pain.    [provider]  KRILL OIL PO Take by mouth.    [provider]  traZODone (DESYREL) 50 MG tablet Take 50 mg by  mouth at bedtime.    [provider]    Family History Family History  Problem Relation Age of Onset   Colon polyps Father    Arthritis Father    Cancer Father        skin, prostate   Hearing loss Father    Hypertension Father    Stroke Father    Cancer Mother        lung   Heart disease Paternal Uncle    Heart disease Paternal Uncle    Cancer Paternal Uncle     Social History Social History   Tobacco Use   Smoking status: Former    Packs/day: 0.25    Years: 30.00    Total pack years: 7.50    Types: Cigarettes     Quit date: 09/29/1999    Years since quitting: 23.0   Smokeless tobacco: Never   Tobacco comments:    quit smoking oct 2000  Substance Use Topics   Alcohol use: Yes    Comment: rare beer   Drug use: No     Allergies   Patient has no known allergies.   Review of Systems Review of Systems Per HPI  Physical Exam Triage Vital Signs ED Triage Vitals  Enc Vitals Group     BP 10/27/22 1455 111/69     Pulse Rate 10/27/22 1455 71     Resp 10/27/22 1455 20     Temp 10/27/22 1455 98.4 F (36.9 C)     Temp Source 10/27/22 1455 Oral     SpO2 10/27/22 1455 95 %     Weight --      Height --      Head Circumference --      Peak Flow --      Pain Score 10/27/22 1456 1     Pain Loc --      Pain Edu? --      Excl. in [place]? --    No data found.  Updated Vital Signs BP 111/69 (BP Location: Right Arm)   Pulse 71   Temp 98.4 F (36.9 C) (Oral)   Resp 20   SpO2 95%   Visual Acuity Right Eye Distance:   Left Eye Distance:   Bilateral Distance:    Right Eye Near:   Left Eye Near:    Bilateral Near:     Physical Exam Vitals and nursing note reviewed.  Constitutional:      Appearance: Normal appearance.  HENT:     Head: Atraumatic.  Eyes:     Extraocular Movements: Extraocular movements intact.     Conjunctiva/sclera: Conjunctivae normal.  Cardiovascular:     Rate and Rhythm: Normal rate and regular rhythm.  Pulmonary:     Effort: Pulmonary effort is normal.     Breath sounds: Normal breath sounds.  Musculoskeletal:        General: Signs of injury present. No deformity. Normal range of motion.     Cervical back: Normal range of motion and neck supple.     Comments: Range of motion of the left thumb intact  Skin:    General: Skin is warm.     Comments: Linear well approximated laceration present to the lateral aspect of left thumb, bleeding well controlled, no foreign body noted  Neurological:     General: No focal deficit present.     Mental Status: He is oriented  to person, place, and time.     Comments: Left hand neurovascularly intact  Psychiatric:  Mood and Affect: Mood normal.        Thought Content: Thought content normal.        Judgment: Judgment normal.    UC Treatments / Results  Labs (all labs ordered are listed, but only abnormal results are displayed) Labs Reviewed - No data to display  EKG   Radiology No results found.  Procedures Laceration Repair  Date/Time: 10/27/2022 3:23 PM  Performed by: Volney American, PA-C Authorized by: Volney American, PA-C   Consent:    Consent obtained:  Verbal   Consent given by:  Patient   Risks, benefits, and alternatives were discussed: yes     Risks discussed:  Pain, poor cosmetic result and poor wound healing   Alternatives discussed: Sutures versus Dermabond. Universal protocol:    Procedure explained and questions answered to patient or proxy's satisfaction: yes     Relevant documents present and verified: yes     Patient identity confirmed:  Verbally with patient and arm band Anesthesia:    Anesthesia method:  None Laceration details:    Location:  Finger   Finger location:  L thumb   Length (cm):  1.5   Depth (mm):  2 Pre-procedure details:    Preparation:  Patient was prepped and draped in usual sterile fashion Exploration:    Wound exploration: wound explored through full range of motion     Contaminated: no   Treatment:    Area cleansed with:  Chlorhexidine   Amount of cleaning:  Standard   Visualized foreign bodies/material removed: no   Skin repair:    Repair method:  Tissue adhesive Approximation:    Approximation:  Close Repair type:    Repair type:  Simple Post-procedure details:    Dressing:  Non-adherent dressing and splint for protection   Procedure completion:  Tolerated well, no immediate complications  (including critical care time)  Medications Ordered in UC Medications - No data to display  Initial Impression / Assessment  and Plan / UC Course  I have reviewed the triage vital signs and the nursing notes.  Pertinent labs & imaging results that were available during my care of the patient were reviewed by me and considered in my medical decision making (see chart for details).     Wound cleaned thoroughly, Dermabond applied.  Once this was dry, nonadherent dressing and splint was placed for protection.  Hibiclens and mupirocin sent for wound care once the glue comes off and discussed return precautions.  Tdap up-to-date.  Final Clinical Impressions(s) / UC Diagnoses   Final diagnoses:  Laceration of left thumb without foreign body without damage to nail, initial encounter   Discharge Instructions   None    ED Prescriptions     Medication Sig Dispense Auth. Provider   chlorhexidine (HIBICLENS) 4 % external liquid Apply topically daily as needed. 120 mL Volney American, PA-C   mupirocin ointment (BACTROBAN) 2 % Apply 1 Application topically 2 (two) times daily. 22 g Volney American, [place]      PDMP not reviewed this encounter.   Volney American, [place] 10/27/22 1525

## 2022-10-27 NOTE — ED Triage Notes (Signed)
Pt reports was carving a pumpkin with a kiichen knife and reports cut left thumb approx at 11am this am. Bleeding controlled. Thin linear laceration noted to left thumb. Reports tetanus x2 years ago. Not on any blood thinners.

## 2022-12-11 DIAGNOSIS — H40051 Ocular hypertension, right eye: Secondary | ICD-10-CM | POA: Diagnosis not present

## 2022-12-11 DIAGNOSIS — H40053 Ocular hypertension, bilateral: Secondary | ICD-10-CM | POA: Diagnosis not present

## 2023-01-08 DIAGNOSIS — H18413 Arcus senilis, bilateral: Secondary | ICD-10-CM | POA: Diagnosis not present

## 2023-01-08 DIAGNOSIS — H25043 Posterior subcapsular polar age-related cataract, bilateral: Secondary | ICD-10-CM | POA: Diagnosis not present

## 2023-01-08 DIAGNOSIS — H2513 Age-related nuclear cataract, bilateral: Secondary | ICD-10-CM | POA: Diagnosis not present

## 2023-01-08 DIAGNOSIS — H25013 Cortical age-related cataract, bilateral: Secondary | ICD-10-CM | POA: Diagnosis not present

## 2023-01-08 DIAGNOSIS — H2511 Age-related nuclear cataract, right eye: Secondary | ICD-10-CM | POA: Diagnosis not present

## 2023-02-10 ENCOUNTER — Other Ambulatory Visit (HOSPITAL_COMMUNITY): Payer: Self-pay | Admitting: Nurse Practitioner

## 2023-02-10 ENCOUNTER — Ambulatory Visit (HOSPITAL_COMMUNITY)
Admission: RE | Admit: 2023-02-10 | Discharge: 2023-02-10 | Disposition: A | Discharge status: Home / Self Care | Payer: Medicare Other | Source: Ambulatory Visit | Attending: Nurse Practitioner | Admitting: Nurse Practitioner

## 2023-02-10 DIAGNOSIS — N50812 Left testicular pain: Secondary | ICD-10-CM | POA: Diagnosis present

## 2023-02-16 DIAGNOSIS — M9905 Segmental and somatic dysfunction of pelvic region: Secondary | ICD-10-CM | POA: Diagnosis not present

## 2023-02-16 DIAGNOSIS — M25551 Pain in right hip: Secondary | ICD-10-CM | POA: Diagnosis not present

## 2023-02-16 DIAGNOSIS — M62830 Muscle spasm of back: Secondary | ICD-10-CM | POA: Diagnosis not present

## 2023-02-16 DIAGNOSIS — M9902 Segmental and somatic dysfunction of thoracic region: Secondary | ICD-10-CM | POA: Diagnosis not present

## 2023-02-16 DIAGNOSIS — M9903 Segmental and somatic dysfunction of lumbar region: Secondary | ICD-10-CM | POA: Diagnosis not present

## 2023-02-18 DIAGNOSIS — M9903 Segmental and somatic dysfunction of lumbar region: Secondary | ICD-10-CM | POA: Diagnosis not present

## 2023-02-18 DIAGNOSIS — M9902 Segmental and somatic dysfunction of thoracic region: Secondary | ICD-10-CM | POA: Diagnosis not present

## 2023-02-18 DIAGNOSIS — M25551 Pain in right hip: Secondary | ICD-10-CM | POA: Diagnosis not present

## 2023-02-18 DIAGNOSIS — M9905 Segmental and somatic dysfunction of pelvic region: Secondary | ICD-10-CM | POA: Diagnosis not present

## 2023-02-18 DIAGNOSIS — M62830 Muscle spasm of back: Secondary | ICD-10-CM | POA: Diagnosis not present

## 2023-03-02 ENCOUNTER — Ambulatory Visit (INDEPENDENT_AMBULATORY_CARE_PROVIDER_SITE_OTHER): Payer: Medicare Other | Admitting: Urology

## 2023-03-02 VITALS — BP 123/69 | HR 71 | Ht 72.0 in | Wt 135.0 lb

## 2023-03-02 DIAGNOSIS — N50812 Left testicular pain: Secondary | ICD-10-CM

## 2023-03-02 DIAGNOSIS — Z8546 Personal history of malignant neoplasm of prostate: Secondary | ICD-10-CM

## 2023-03-02 MED ORDER — MELOXICAM 7.5 MG PO TABS: 7.5000 mg | ORAL_TABLET | Freq: Every day | ORAL | 0 refills | Status: AC

## 2023-03-02 NOTE — Progress Notes (Signed)
03/02/2023 3:27 PM   Revonda Standard 08-26-1951 [id number]  Referring provider: Perfecto Kingdom, NP [id number] [place],  [place] [address]  No chief complaint on file.   HPI:  New patient-  1) left-sided scrotal pain-patient noted about 2 weeks ago. He had a right inguinal hernia repair.  Occasional back pain. No hip pain. He took antibiotics for almost 2 weeks with no change in the pain.  He underwent scrotal ultrasound 02/10/2023 which was benign.  There was a 1.37 cm right spermatocele and an 8 mm left spermatocele. Hurts worse when sitting or laying down. He walks daily.   2) h/o PCa - he underwent RALP with Dr. Tresa Moore in 2014.  Due for a PSA. He voids with a good stream. No incontinence.   Today, seen for the above.   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   PMH: Past Medical History:  Diagnosis Date   Arthritis    knees, spine, thumbs   Diverticulitis    GERD (gastroesophageal reflux disease)    HLD (hyperlipidemia) 03/26/2017   Peptic ulcer disease    Prostate cancer ([place])    prostate   Restless leg syndrome    amitripytline helps    Surgical History: Past Surgical History:  Procedure Laterality Date   HERNIA REPAIR     INGUINAL HERNIA REPAIR Right 09/29/2014   Procedure: OPEN RIGHT IINGUINAL HERNIA REPAIR WITH MESH;  Surgeon: Gayland Curry, MD;  Location: [facility];  Service: General;  Laterality: Right;   INSERTION OF MESH Right 09/29/2014   Procedure: INSERTION OF MESH;  Surgeon: Gayland Curry, MD;  Location: [facility];  Service: General;  Laterality: Right;   KNEE ARTHROSCOPY  10/2003   LYMPHADENECTOMY Bilateral 03/18/2013   Procedure: LYMPHADENECTOMY;  Surgeon: Alexis Frock, MD;  Location: [facility];  Service: Urology;  Laterality: Bilateral;   NASAL POLYP EXCISION  1989   ROBOT ASSISTED LAPAROSCOPIC RADICAL PROSTATECTOMY N/A 03/18/2013   Procedure: ROBOTIC ASSISTED LAPAROSCOPIC RADICAL PROSTATECTOMY;  Surgeon: Alexis Frock, MD;  Location: [facility];  Service:  Urology;  Laterality: N/A;    Home Medications:  Allergies as of 03/02/2023   No Known Allergies      Medication List        Accurate as of March 02, 2023  3:27 PM. If you have any questions, ask your nurse or doctor.          albuterol 108 (90 Base) MCG/ACT inhaler Commonly known as: VENTOLIN HFA Inhale 1-2 puffs into the lungs every 6 (six) hours as needed for wheezing or shortness of breath.   chlorhexidine 4 % external liquid Commonly known as: Hibiclens Apply topically daily as needed.   cholecalciferol 1000 units tablet Commonly known as: VITAMIN D Take 2,000 Units by mouth daily.   cyclobenzaprine 5 MG tablet Commonly known as: FLEXERIL Take 1 tablet (5 mg total) by mouth 3 (three) times daily as needed for muscle spasms.   ibuprofen 200 MG tablet Commonly known as: ADVIL Take 400 mg by mouth once as needed for headache or mild pain.   KRILL OIL PO Take by mouth.   mupirocin ointment 2 % Commonly known as: BACTROBAN Apply 1 Application topically 2 (two) times daily.   traZODone 50 MG tablet Commonly known as: DESYREL Take 50 mg by mouth at bedtime.   VITAMIN B 12 PO Take by mouth.        Allergies: No Known Allergies  Family History: Family History  Problem Relation Age of  Onset   Colon polyps Father    Arthritis Father    Cancer Father        skin, prostate   Hearing loss Father    Hypertension Father    Stroke Father    Cancer Mother        lung   Heart disease Paternal Uncle    Heart disease Paternal Uncle    Cancer Paternal Uncle     Social History:  reports that he quit smoking about 23 years ago. His smoking use included cigarettes. He has a 7.50 pack-year smoking history. He has never used smokeless tobacco. He reports current alcohol use. He reports that he does not use drugs.   Physical Exam: BP 123/69   Pulse 71   Ht 6' (1.829 m)   Wt 135 lb (61.2 kg)   BMI 18.31 kg/m   Constitutional:  Alert and oriented, No acute  distress. HEENT: [place] AT, moist mucus membranes.  Trachea midline, no masses. Cardiovascular: No clubbing, cyanosis, or edema. Respiratory: Normal respiratory effort, no increased work of breathing. GI: Abdomen is soft, nontender, nondistended, no abdominal masses GU: No CVA tenderness Lymph: No cervical or inguinal lymphadenopathy. Skin: No rashes, bruises or suspicious lesions. Neurologic: Grossly intact, no focal deficits, moving all 4 extremities. Psychiatric: Normal mood and affect.  Laboratory Data: Lab Results  Component Value Date   WBC 5.7 10/07/2021   HGB 14.9 10/07/2021   HCT 44.1 10/07/2021   MCV 94 10/07/2021   PLT 269 10/07/2021    Lab Results  Component Value Date   CREATININE 0.88 08/21/2021    No results found for: "PSA"  No results found for: "TESTOSTERONE"  No results found for: "HGBA1C"  Urinalysis    Component Value Date/Time   COLORURINE YELLOW 02/26/2017 [place] 02/26/2017 1016   LABSPEC 1.016 02/26/2017 1016   PHURINE 6.0 02/26/2017 1016   GLUCOSEU NEGATIVE 02/26/2017 1016   HGBUR NEGATIVE 02/26/2017 1016   [place] 02/26/2017 1016   [place] 02/26/2017 1016   PROTEINUR NEGATIVE 02/26/2017 1016   NITRITE NEGATIVE 02/26/2017 1016   LEUKOCYTESUR NEGATIVE 02/26/2017 1016    No results found for: "LABMICR", "WBCUA", "RBCUA", "LABEPIT", "MUCUS", "BACTERIA"    Assessment & Plan:    1) left scrotal pain - benign exam and imaging. Trial of NSAIDs.   2) h/o PCa - send PSA   No follow-ups on file.  Festus Aloe, MD  [facility]  [address] [place], [place] [address] [phone]

## 2023-03-03 LAB — URINALYSIS, ROUTINE W REFLEX MICROSCOPIC
Bilirubin, UA: NEGATIVE
Glucose, UA: NEGATIVE
Leukocytes,UA: NEGATIVE
Nitrite, UA: NEGATIVE
Protein,UA: NEGATIVE
Specific Gravity, UA: <1.005 — ABNORMAL LOW (ref 1.005–1.030)
Urobilinogen, Ur: 1 mg/dL (ref 0.2–1.0)
pH, UA: 7 (ref 5.0–7.5)

## 2023-03-03 LAB — MICROSCOPIC EXAMINATION: Bacteria, UA: NONE SEEN

## 2023-03-03 LAB — PSA: Prostate Specific Ag, Serum: <0.1 ng/mL (ref 0.0–4.0)

## 2023-03-04 ENCOUNTER — Telehealth: Payer: Self-pay

## 2023-03-04 NOTE — Telephone Encounter (Signed)
Return call to patient wife Wells Guiles. Made Wells Guiles aware that we just received PSA results today and were sent to the provider for review. Wells Guiles is aware that once MD review results someone will reach out to with the results. Wells Guiles voiced understanding.

## 2023-03-10 ENCOUNTER — Telehealth: Payer: Self-pay

## 2023-03-10 NOTE — Telephone Encounter (Signed)
Patient's wife called requesting lab results from 03/04.  Are you able to review results.

## 2023-03-11 NOTE — Telephone Encounter (Signed)
Patient's wife aware of results  

## 2023-03-25 DIAGNOSIS — H2511 Age-related nuclear cataract, right eye: Secondary | ICD-10-CM | POA: Diagnosis not present

## 2023-03-25 DIAGNOSIS — Z961 Presence of intraocular lens: Secondary | ICD-10-CM | POA: Diagnosis not present

## 2023-03-26 DIAGNOSIS — H2512 Age-related nuclear cataract, left eye: Secondary | ICD-10-CM | POA: Diagnosis not present

## 2023-04-01 DIAGNOSIS — H2511 Age-related nuclear cataract, right eye: Secondary | ICD-10-CM | POA: Diagnosis not present

## 2023-04-01 DIAGNOSIS — Z961 Presence of intraocular lens: Secondary | ICD-10-CM | POA: Diagnosis not present

## 2023-04-08 DIAGNOSIS — Z961 Presence of intraocular lens: Secondary | ICD-10-CM | POA: Diagnosis not present

## 2023-04-08 DIAGNOSIS — H2512 Age-related nuclear cataract, left eye: Secondary | ICD-10-CM | POA: Diagnosis not present

## 2023-04-13 ENCOUNTER — Ambulatory Visit: Payer: Medicare Other | Admitting: Urology

## 2023-04-15 DIAGNOSIS — Z961 Presence of intraocular lens: Secondary | ICD-10-CM | POA: Diagnosis not present

## 2023-04-15 DIAGNOSIS — H2512 Age-related nuclear cataract, left eye: Secondary | ICD-10-CM | POA: Diagnosis not present

## 2023-04-30 ENCOUNTER — Telehealth: Payer: Self-pay

## 2023-04-30 MED ORDER — MELOXICAM 7.5 MG PO TABS: 7.5000 mg | ORAL_TABLET | Freq: Every day | ORAL | 0 refills | Status: AC

## 2023-04-30 NOTE — Telephone Encounter (Signed)
Patient wife called office today, patient still complaining of pain in testicle area despite antiinflammatory received.  Wife asking to discuss possible antibiotic and would like nurse to return call.

## 2023-04-30 NOTE — Telephone Encounter (Signed)
Wife called and made aware that meloxicam 7.5 mg po daily x 7 days was sent to pharmacy for patient per Dr. Mena Goes.

## 2023-04-30 NOTE — Telephone Encounter (Signed)
Wife called stating that after patient completed the 7 days of Mobic back in March his testicle pain got much better. However, patient was doing yard work which involved some heavy lifting and the pain has returned. Wife does not want patient to get as bad as before and request a refill of the Mobic.  Ok to refill?

## 2023-05-14 DIAGNOSIS — R7301 Impaired fasting glucose: Secondary | ICD-10-CM | POA: Diagnosis not present

## 2023-05-14 DIAGNOSIS — E782 Mixed hyperlipidemia: Secondary | ICD-10-CM | POA: Diagnosis not present

## 2023-05-20 DIAGNOSIS — Z0000 Encounter for general adult medical examination without abnormal findings: Secondary | ICD-10-CM | POA: Diagnosis not present

## 2023-05-20 DIAGNOSIS — E875 Hyperkalemia: Secondary | ICD-10-CM | POA: Diagnosis not present

## 2023-05-20 DIAGNOSIS — F17201 Nicotine dependence, unspecified, in remission: Secondary | ICD-10-CM | POA: Diagnosis not present

## 2023-05-20 DIAGNOSIS — G4700 Insomnia, unspecified: Secondary | ICD-10-CM | POA: Diagnosis not present

## 2023-05-20 DIAGNOSIS — R7301 Impaired fasting glucose: Secondary | ICD-10-CM | POA: Diagnosis not present

## 2023-05-20 DIAGNOSIS — M25551 Pain in right hip: Secondary | ICD-10-CM | POA: Diagnosis not present

## 2023-05-20 DIAGNOSIS — H9193 Unspecified hearing loss, bilateral: Secondary | ICD-10-CM | POA: Diagnosis not present

## 2023-05-20 DIAGNOSIS — S83242D Other tear of medial meniscus, current injury, left knee, subsequent encounter: Secondary | ICD-10-CM | POA: Diagnosis not present

## 2023-05-20 DIAGNOSIS — E782 Mixed hyperlipidemia: Secondary | ICD-10-CM | POA: Diagnosis not present

## 2023-05-20 DIAGNOSIS — I700 Atherosclerosis of aorta: Secondary | ICD-10-CM | POA: Diagnosis not present

## 2023-05-20 DIAGNOSIS — R413 Other amnesia: Secondary | ICD-10-CM | POA: Diagnosis not present

## 2023-05-20 DIAGNOSIS — X58XXXD Exposure to other specified factors, subsequent encounter: Secondary | ICD-10-CM | POA: Diagnosis not present

## 2023-06-29 DIAGNOSIS — E782 Mixed hyperlipidemia: Secondary | ICD-10-CM | POA: Diagnosis not present

## 2023-06-29 DIAGNOSIS — R7301 Impaired fasting glucose: Secondary | ICD-10-CM | POA: Diagnosis not present

## 2023-07-08 DIAGNOSIS — R413 Other amnesia: Secondary | ICD-10-CM | POA: Diagnosis not present

## 2023-07-08 DIAGNOSIS — R7301 Impaired fasting glucose: Secondary | ICD-10-CM | POA: Diagnosis not present

## 2023-07-08 DIAGNOSIS — Z0001 Encounter for general adult medical examination with abnormal findings: Secondary | ICD-10-CM | POA: Diagnosis not present

## 2023-07-08 DIAGNOSIS — J302 Other seasonal allergic rhinitis: Secondary | ICD-10-CM | POA: Diagnosis not present

## 2023-07-08 DIAGNOSIS — E782 Mixed hyperlipidemia: Secondary | ICD-10-CM | POA: Diagnosis not present

## 2023-07-08 DIAGNOSIS — M25551 Pain in right hip: Secondary | ICD-10-CM | POA: Diagnosis not present

## 2023-07-08 DIAGNOSIS — H9193 Unspecified hearing loss, bilateral: Secondary | ICD-10-CM | POA: Diagnosis not present

## 2023-07-08 DIAGNOSIS — E875 Hyperkalemia: Secondary | ICD-10-CM | POA: Diagnosis not present

## 2023-07-08 DIAGNOSIS — S83242D Other tear of medial meniscus, current injury, left knee, subsequent encounter: Secondary | ICD-10-CM | POA: Diagnosis not present

## 2023-07-08 DIAGNOSIS — G4700 Insomnia, unspecified: Secondary | ICD-10-CM | POA: Diagnosis not present

## 2023-07-08 DIAGNOSIS — F17201 Nicotine dependence, unspecified, in remission: Secondary | ICD-10-CM | POA: Diagnosis not present

## 2023-09-03 DIAGNOSIS — J329 Chronic sinusitis, unspecified: Secondary | ICD-10-CM | POA: Diagnosis not present

## 2023-09-03 DIAGNOSIS — Z87891 Personal history of nicotine dependence: Secondary | ICD-10-CM | POA: Diagnosis not present

## 2023-10-21 DIAGNOSIS — M9903 Segmental and somatic dysfunction of lumbar region: Secondary | ICD-10-CM | POA: Diagnosis not present

## 2023-10-21 DIAGNOSIS — M9905 Segmental and somatic dysfunction of pelvic region: Secondary | ICD-10-CM | POA: Diagnosis not present

## 2023-10-21 DIAGNOSIS — M9902 Segmental and somatic dysfunction of thoracic region: Secondary | ICD-10-CM | POA: Diagnosis not present

## 2023-10-21 DIAGNOSIS — M25551 Pain in right hip: Secondary | ICD-10-CM | POA: Diagnosis not present

## 2023-10-26 DIAGNOSIS — M9905 Segmental and somatic dysfunction of pelvic region: Secondary | ICD-10-CM | POA: Diagnosis not present

## 2023-10-26 DIAGNOSIS — M25551 Pain in right hip: Secondary | ICD-10-CM | POA: Diagnosis not present

## 2023-10-26 DIAGNOSIS — M9902 Segmental and somatic dysfunction of thoracic region: Secondary | ICD-10-CM | POA: Diagnosis not present

## 2023-10-26 DIAGNOSIS — M9903 Segmental and somatic dysfunction of lumbar region: Secondary | ICD-10-CM | POA: Diagnosis not present

## 2023-12-16 DIAGNOSIS — H524 Presbyopia: Secondary | ICD-10-CM | POA: Diagnosis not present

## 2023-12-16 DIAGNOSIS — H53143 Visual discomfort, bilateral: Secondary | ICD-10-CM | POA: Diagnosis not present

## 2023-12-16 DIAGNOSIS — H52223 Regular astigmatism, bilateral: Secondary | ICD-10-CM | POA: Diagnosis not present

## 2023-12-16 DIAGNOSIS — Z961 Presence of intraocular lens: Secondary | ICD-10-CM | POA: Diagnosis not present

## 2023-12-24 ENCOUNTER — Other Ambulatory Visit (HOSPITAL_COMMUNITY): Payer: Self-pay | Admitting: Internal Medicine

## 2023-12-24 DIAGNOSIS — R4182 Altered mental status, unspecified: Secondary | ICD-10-CM

## 2023-12-24 DIAGNOSIS — M1990 Unspecified osteoarthritis, unspecified site: Secondary | ICD-10-CM | POA: Diagnosis not present

## 2023-12-24 DIAGNOSIS — Z23 Encounter for immunization: Secondary | ICD-10-CM | POA: Diagnosis not present

## 2023-12-31 ENCOUNTER — Ambulatory Visit (HOSPITAL_COMMUNITY)
Admission: RE | Admit: 2023-12-31 | Discharge: 2023-12-31 | Disposition: A | Discharge status: Home / Self Care | Payer: Medicare Other | Source: Ambulatory Visit | Attending: Internal Medicine | Admitting: Internal Medicine

## 2023-12-31 DIAGNOSIS — R4182 Altered mental status, unspecified: Secondary | ICD-10-CM | POA: Diagnosis present

## 2024-01-20 ENCOUNTER — Encounter: Payer: Self-pay | Admitting: *Deleted

## 2024-01-20 ENCOUNTER — Ambulatory Visit: Payer: Medicare Other | Admitting: Diagnostic Neuroimaging

## 2024-01-20 ENCOUNTER — Encounter: Payer: Self-pay | Admitting: Diagnostic Neuroimaging

## 2024-01-20 VITALS — BP 120/64 | HR 65 | Ht 72.0 in | Wt 131.0 lb

## 2024-01-20 DIAGNOSIS — G454 Transient global amnesia: Secondary | ICD-10-CM | POA: Diagnosis not present

## 2024-01-20 NOTE — Progress Notes (Signed)
[facility]  PATIENT: Dominic Oliver DOB: 07-Dec-1951  REFERRING CLINICIAN: Benita Stabile, MD HISTORY FROM: patient REASON FOR VISIT: new consult   HISTORICAL  CHIEF COMPLAINT:  Chief Complaint  Patient presents with   New Patient (Initial Visit)    Pt in 6 with wife and daughter Pt here for altered mental status     HISTORY OF PRESENT ILLNESS:   73 year old male here for evaluation of transient confusion.  12/19/2023 patient had fairly sudden onset transient confusion and memory loss, having difficulty remembering his daughter's name and the family plans for that week.  His blood pressure was noted to be somewhat elevated with systolic greater than 160 or 180.  When his daughter came to visit she noted that his skin was somewhat gray and pale in color.  Within an hour or so symptoms resolved.  Over the past few months or possibly 1 to 2 years there has been some gradual onset and progression of short-term memory loss, confusion, memory and cognitive decline.  He has also had vision and hearing loss over time.  His ADLs have been slightly affected due to combination of factors including retirement, vision, hearing, and cognitive issues.  He was previously very high functioning in his career, traveling around the world, training other people.  He retired about age 42 years old.   REVIEW OF SYSTEMS: Full 14 system review of systems performed and negative with exception of: as per HPI.  ALLERGIES: No Known Allergies  HOME MEDICATIONS: Outpatient Medications Prior to Visit  Medication Sig Dispense Refill   ibuprofen (ADVIL,MOTRIN) 200 MG tablet Take 200 mg by mouth as needed for headache or mild pain (pain score 1-3).     albuterol (VENTOLIN HFA) 108 (90 Base) MCG/ACT inhaler Inhale 1-2 puffs into the lungs every 6 (six) hours as needed for wheezing or shortness of breath. 18 g 0   chlorhexidine (HIBICLENS) 4 % external liquid Apply topically daily as needed.  (Patient not taking: Reported on 03/02/2023) 120 mL 0   cholecalciferol (VITAMIN D) 1000 units tablet Take 2,000 Units by mouth daily. (Patient not taking: Reported on 01/20/2024)     Cyanocobalamin (VITAMIN B 12 PO) Take by mouth. (Patient not taking: Reported on 01/20/2024)     cyclobenzaprine (FLEXERIL) 5 MG tablet Take 1 tablet (5 mg total) by mouth 3 (three) times daily as needed for muscle spasms. (Patient not taking: Reported on 03/02/2023) 30 tablet 0   KRILL OIL PO Take by mouth. (Patient not taking: Reported on 03/02/2023)     meloxicam (MOBIC) 7.5 MG tablet Take 1 tablet (7.5 mg total) by mouth daily. 7 tablet 0   mupirocin ointment (BACTROBAN) 2 % Apply 1 Application topically 2 (two) times daily. (Patient not taking: Reported on 03/02/2023) 22 g 0   traZODone (DESYREL) 50 MG tablet Take 50 mg by mouth at bedtime. (Patient not taking: Reported on 03/02/2023)     No facility-administered medications prior to visit.    PAST MEDICAL HISTORY: Past Medical History:  Diagnosis Date   Arthritis    knees, spine, thumbs   Diverticulitis    GERD (gastroesophageal reflux disease)    HLD (hyperlipidemia) 03/26/2017   Peptic ulcer disease    Prostate cancer (HCC)    prostate   Restless leg syndrome    amitripytline helps    PAST SURGICAL HISTORY: Past Surgical History:  Procedure Laterality Date   HERNIA REPAIR     INGUINAL HERNIA REPAIR Right 09/29/2014  Procedure: OPEN RIGHT IINGUINAL HERNIA REPAIR WITH MESH;  Surgeon: Atilano Ina, MD;  Location: [facility];  Service: General;  Laterality: Right;   INSERTION OF MESH Right 09/29/2014   Procedure: INSERTION OF MESH;  Surgeon: Atilano Ina, MD;  Location: [facility];  Service: General;  Laterality: Right;   KNEE ARTHROSCOPY  10/2003   LYMPHADENECTOMY Bilateral 03/18/2013   Procedure: LYMPHADENECTOMY;  Surgeon: Sebastian Ache, MD;  Location: [facility];  Service: Urology;  Laterality: Bilateral;   NASAL POLYP EXCISION  1989   ROBOT ASSISTED LAPAROSCOPIC  RADICAL PROSTATECTOMY N/A 03/18/2013   Procedure: ROBOTIC ASSISTED LAPAROSCOPIC RADICAL PROSTATECTOMY;  Surgeon: Sebastian Ache, MD;  Location: [facility];  Service: Urology;  Laterality: N/A;    FAMILY HISTORY: Family History  Problem Relation Age of Onset   Cancer Mother        lung   Colon polyps Father    Arthritis Father    Cancer Father        skin, prostate   Hearing loss Father    Hypertension Father    Stroke Father    Dementia Sister    Heart disease Paternal Uncle    Heart disease Paternal Uncle    Cancer Paternal Uncle     SOCIAL HISTORY: Social History   Socioeconomic History   Marital status: Married    Spouse name: Materials engineer   Number of children: 2   Years of education: 13   Highest education level: Not on file  Occupational History   Occupation: retired  Tobacco Use   Smoking status: Former    Current packs/day: 0.00    Average packs/day: 0.3 packs/day for 30.0 years (7.5 ttl pk-yrs)    Types: Cigarettes    Start date: 09/28/1969    Quit date: 09/29/1999    Years since quitting: 24.3   Smokeless tobacco: Never   Tobacco comments:    quit smoking oct 2000  Substance and Sexual Activity   Alcohol use: Yes    Comment: rare beer   Drug use: No   Sexual activity: Yes  Other Topics Concern   Not on file  Social History Narrative   Lives at home with wife Rebeccah   Grandson stays frequently      Likes to walk, many projects   Retired    Chief Executive Officer Drivers of Corporate investment banker Strain: Not on [employer] Insecurity: Not on file  Transportation Needs: Not on file  Physical Activity: Not on file  Stress: Not on file  Social Connections: Unknown (05/12/2022)   Received from [facility]   Social Network    Social Network: Not on file  Intimate Partner Violence: Unknown (04/03/2022)   Received from [facility], [facility]   HITS    Physically Hurt: Not on file    Insult or Talk Down To: Not on file    Threaten Physical Harm:  Not on file    Scream or Curse: Not on file     PHYSICAL EXAM  GENERAL EXAM/CONSTITUTIONAL: Vitals:  Vitals:   01/20/24 1139  BP: 120/64  Pulse: 65  Weight: 131 lb (59.4 kg)  Height: 6' (1.829 m)   Body mass index is 17.77 kg/m. Wt Readings from Last 3 Encounters:  01/20/24 131 lb (59.4 kg)  03/02/23 135 lb (61.2 kg)  10/07/21 137 lb 1.9 oz (62.2 kg)   Patient is in no distress; well developed, nourished and groomed; neck is supple  CARDIOVASCULAR: Examination of carotid arteries  is normal; no carotid bruits Regular rate and rhythm, no murmurs Examination of peripheral vascular system by observation and palpation is normal  EYES: Ophthalmoscopic exam of optic discs and posterior segments is normal; no papilledema or hemorrhages No results found.  MUSCULOSKELETAL: Gait, strength, tone, movements noted in Neurologic exam below  NEUROLOGIC: MENTAL STATUS:     01/20/2024   11:49 AM  MMSE - Mini Mental State Exam  Orientation to time 0  Orientation to Place 3  Registration 3  Attention/ Calculation 0  Recall 0  Language- name 2 objects 2  Language- repeat 1  Language- follow 3 step command 0  Language- read & follow direction 0  Write a sentence 0  Copy design 0  Total score 9   awake, alert, oriented to person DECR MEMORY DECR attention and concentration language fluent, comprehension intact, naming intact fund of knowledge appropriate DECR INSIGHT  CRANIAL NERVE:  2nd - no papilledema on fundoscopic exam 2nd, 3rd, 4th, 6th - pupils equal and reactive to light, visual fields full to confrontation, extraocular muscles intact, no nystagmus 5th - facial sensation symmetric 7th - facial strength symmetric 8th - hearing intact 9th - palate elevates symmetrically, uvula midline 11th - shoulder shrug symmetric 12th - tongue protrusion midline  MOTOR:  normal bulk and tone, full strength in the BUE, BLE  SENSORY:  normal and symmetric to light touch,  temperature, vibration  COORDINATION:  finger-nose-finger, fine finger movements normal  REFLEXES:  deep tendon reflexes TRACE and symmetric  GAIT/STATION:  narrow based gait     DIAGNOSTIC DATA (LABS, IMAGING, TESTING) - I reviewed patient records, labs, notes, testing and imaging myself where available.  Lab Results  Component Value Date   WBC 5.7 10/07/2021   HGB 14.9 10/07/2021   HCT 44.1 10/07/2021   MCV 94 10/07/2021   PLT 269 10/07/2021      Component Value Date/Time   NA 143 08/21/2021 2158   K 3.6 08/21/2021 2158   CL 109 08/21/2021 2158   CO2 26 08/21/2021 2158   GLUCOSE 122 (H) 08/21/2021 2158   BUN 18 08/21/2021 2158   CREATININE 0.88 08/21/2021 2158   CREATININE 1.08 02/26/2017 1016   CALCIUM 8.8 (L) 08/21/2021 2158   PROT 6.7 02/26/2017 1016   ALBUMIN 4.2 02/26/2017 1016   AST 23 02/26/2017 1016   ALT 16 02/26/2017 1016   ALKPHOS 34 (L) 02/26/2017 1016   BILITOT 1.4 (H) 02/26/2017 1016   GFRNONAA >60 08/21/2021 2158   GFRAA >90 03/08/2013 1425   Lab Results  Component Value Date   CHOL 239 (H) 02/26/2017   HDL 75 02/26/2017   LDLCALC 148 (H) 02/26/2017   TRIG 78 02/26/2017   CHOLHDL 3.2 02/26/2017   No results found for: "HGBA1C" No results found for: "VITAMINB12" Lab Results  Component Value Date   TSH 2.010 10/07/2021    12/31/03 CT head [I reviewed images myself and agree with interpretation. -VRP]  1. No acute intracranial abnormality. But suspicion of disproportionate mesial temporal lobe volume loss since 2019, raising the possibility of neurodegenerative disease. 2. Otherwise negative for age noncontrast Head CT.   ASSESSMENT AND PLAN  73 y.o. year old male here with:  Dx:  1. Transient global amnesia     PLAN:  COGNITIVE DECLINE (suspect neurodegenerative moderate dementia) - consider memantine 10mg  at bedtime; increase to twice a day after 1-2 weeks - try to stay active physically and get some exercise (at least  15-30 minutes per day) -  eat a nutritious diet with lean protein, plants / vegetables, whole grains; avoid ultra-processed foods - increase social activities, brain stimulation, games, puzzles, hobbies, crafts, arts, music; try new activities; keep it fun! - aim for at least 7-8 hours sleep per night (or more) - avoid smoking and alcohol - caution with medications, finances; no driving - safety / supervision issues reviewed - caregiver resources provided (including [email or web address])  TRANSIENT GLOBAL AMNESIA (12/19/23) - Unclear whether this event was related to classic transient global amnesia versus TIA, seizure or related to suspected underlying neurodegenerative disorder - check MRI brain, EEG  Orders Placed This Encounter  Procedures   MR BRAIN W WO CONTRAST   ATN PROFILE   EEG adult   Return in about 6 months (around 07/19/2024) for MyChart visit (15 min).  I spent 45 minutes of face-to-face and non-face-to-face time with patient.  This included previsit chart review, lab review, study review, order entry, electronic health record documentation, patient education.     Suanne Marker, MD 01/20/2024, 12:27 PM Certified in Neurology, Neurophysiology and Neuroimaging  [facility] [address] [place], [place] [address] [phone]

## 2024-01-21 ENCOUNTER — Telehealth: Payer: Self-pay | Admitting: Diagnostic Neuroimaging

## 2024-01-21 NOTE — Telephone Encounter (Signed)
[employer] medicare NPR sent to GI [phone] 

## 2024-01-24 LAB — ATN PROFILE
A -- Beta-amyloid 42/40 Ratio: 0.098 — ABNORMAL LOW (ref >0.102)
Beta-amyloid 40: 225.31 pg/mL
Beta-amyloid 42: 22.11 pg/mL
N -- NfL, Plasma: 5.35 pg/mL (ref 0.00–7.64)
T -- p-tau181: 2.26 pg/mL — ABNORMAL HIGH (ref 0.00–0.97)

## 2024-02-04 ENCOUNTER — Telehealth: Payer: Self-pay | Admitting: *Deleted

## 2024-02-04 NOTE — Telephone Encounter (Signed)
-----   Message from [facility]on [facility] sent at 02/04/2024 12:53 PM EST ----- Lab results are consistent with the presence of Alzheimer's related pathology. Continue current dementia care plan.  -VRP

## 2024-02-04 NOTE — Telephone Encounter (Signed)
 Spoke to wife (checked DPR) Gave lab results Gave Dr. Salli Crawley recommendations to continue with dementia care plan Wife expressed understanding and thanked me for calling

## 2024-02-10 ENCOUNTER — Other Ambulatory Visit: Payer: Medicare Other | Admitting: *Deleted

## 2024-02-23 ENCOUNTER — Ambulatory Visit
Admission: RE | Admit: 2024-02-23 | Discharge: 2024-02-23 | Disposition: A | Discharge status: Home / Self Care | Payer: Medicare Other | Source: Ambulatory Visit | Attending: Diagnostic Neuroimaging | Admitting: Diagnostic Neuroimaging

## 2024-02-23 DIAGNOSIS — G454 Transient global amnesia: Secondary | ICD-10-CM

## 2024-02-23 MED ORDER — GADOPICLENOL 0.5 MMOL/ML IV SOLN
6.0000 mL | Freq: Once | INTRAVENOUS | Status: AC | PRN
  Administered 2024-02-23: 6 mL via INTRAVENOUS

## 2024-02-24 ENCOUNTER — Ambulatory Visit: Payer: Medicare Other | Admitting: Diagnostic Neuroimaging

## 2024-02-24 DIAGNOSIS — R4182 Altered mental status, unspecified: Secondary | ICD-10-CM | POA: Diagnosis not present

## 2024-02-24 DIAGNOSIS — G454 Transient global amnesia: Secondary | ICD-10-CM

## 2024-02-29 ENCOUNTER — Telehealth: Payer: Self-pay | Admitting: Diagnostic Neuroimaging

## 2024-02-29 NOTE — Telephone Encounter (Signed)
 Called and spoke with pt wife and explained we allow 7-14 business days. Pt MRI brain has returned as well. Wife informed we will be in contact with them about those results as well when they return.   Wife verbalized she understood.

## 2024-02-29 NOTE — Telephone Encounter (Signed)
 Pt's wife called wanting to know when the EEG results will be available. Please advise.

## 2024-03-09 MED ORDER — MEMANTINE HCL 28 X 5 MG & 21 X 10 MG PO TABS: ORAL_TABLET | ORAL | 0 refills | Status: DC

## 2024-03-09 NOTE — Addendum Note (Signed)
 Addended by: Judi Cong on: 03/09/2024 02:19 PM   Modules accepted: Orders

## 2024-03-09 NOTE — Telephone Encounter (Signed)
 Lvm 1st attempt by hf 03/09/24

## 2024-03-09 NOTE — Telephone Encounter (Signed)
 Called the patient and reviewed results from the MRI and EEG. She verbalized understanding. She was asking next steps and I reviewed Dr Richrd Humbles notes and he mentioned that he would consider memantine. The pt's wife is going to discuss with their kids and see about starting the medication. Advised I will let Dr Marjory Lies know that she is interested starting this. Explained the medication starter pack as well as the main side effects to look for.

## 2024-03-09 NOTE — Telephone Encounter (Signed)
 Pt returned call and would like to be called back when nurse is available.

## 2024-03-09 NOTE — Procedures (Signed)
   [facility]  EEG (ELECTROENCEPHALOGRAM) REPORT   STUDY DATE: 02/24/2024 PATIENT NAME: JAHAN FRIEDLANDER DOB: 30-Jan-1951 MRN: [id number]  ORDERING CLINICIAN: Joycelyn Schmid, MD   TECHNOLOGIST: Marcheta Grammes TECHNIQUE: Electroencephalogram was recorded utilizing standard 10-20 system of lead placement and reformatted into average and bipolar montages.  RECORDING TIME: 25 minutes ACTIVATION: hyperventilation and photic stimulation  CLINICAL INFORMATION: 73 year old male with abnormal memory lapses.  FINDINGS: Posterior dominant background rhythms, which attenuate with eye opening, ranging 6-7 hertz and 20-30 microvolts. No focal, lateralizing, epileptiform activity or seizures are seen. Patient recorded in the awake and drowsy state. EKG channel shows regular rhythm of 75-80 beats per minute.   IMPRESSION:   Abnormal EEG demonstrating: -Mild diffuse slowing in the theta range (6-7 hertz) consistent with mild encephalopathy.    INTERPRETING PHYSICIAN:  Suanne Marker, MD Certified in Neurology, Neurophysiology and Neuroimaging  [facility] [address], [address] [place], [place] [address] [phone]

## 2024-03-09 NOTE — Telephone Encounter (Signed)
 Pt's wife reports that it has now been 14 days, she would very much like to be called with results to MRI and EEG as soon as possible.

## 2024-03-09 NOTE — Telephone Encounter (Signed)
 MRI findings: Atrophy and microhemorrhages noted. Clinical findings and labs testing are consistent with moderate dementia (alzheimer's type) as discussed in last office visit. No other new or major findings noted. Can setup up revisit (video or office) to discuss further. -VRP  EEG Findings:  Eeg shows general slowing, consistent with dementia. No seizure tendency noted. -VRP

## 2024-03-24 DIAGNOSIS — H18413 Arcus senilis, bilateral: Secondary | ICD-10-CM | POA: Diagnosis not present

## 2024-03-24 DIAGNOSIS — H02831 Dermatochalasis of right upper eyelid: Secondary | ICD-10-CM | POA: Diagnosis not present

## 2024-03-24 DIAGNOSIS — H26493 Other secondary cataract, bilateral: Secondary | ICD-10-CM | POA: Diagnosis not present

## 2024-03-24 DIAGNOSIS — H26491 Other secondary cataract, right eye: Secondary | ICD-10-CM | POA: Diagnosis not present

## 2024-03-24 DIAGNOSIS — Z961 Presence of intraocular lens: Secondary | ICD-10-CM | POA: Diagnosis not present

## 2024-03-31 DIAGNOSIS — Z961 Presence of intraocular lens: Secondary | ICD-10-CM | POA: Diagnosis not present

## 2024-03-31 DIAGNOSIS — H2511 Age-related nuclear cataract, right eye: Secondary | ICD-10-CM | POA: Diagnosis not present

## 2024-04-19 DIAGNOSIS — H26492 Other secondary cataract, left eye: Secondary | ICD-10-CM | POA: Diagnosis not present

## 2024-04-26 DIAGNOSIS — Z961 Presence of intraocular lens: Secondary | ICD-10-CM | POA: Diagnosis not present

## 2024-04-26 DIAGNOSIS — H2512 Age-related nuclear cataract, left eye: Secondary | ICD-10-CM | POA: Diagnosis not present

## 2024-07-05 DIAGNOSIS — R7301 Impaired fasting glucose: Secondary | ICD-10-CM | POA: Diagnosis not present

## 2024-07-05 DIAGNOSIS — E782 Mixed hyperlipidemia: Secondary | ICD-10-CM | POA: Diagnosis not present

## 2024-07-11 DIAGNOSIS — Z9889 Other specified postprocedural states: Secondary | ICD-10-CM | POA: Diagnosis not present

## 2024-07-11 DIAGNOSIS — G454 Transient global amnesia: Secondary | ICD-10-CM | POA: Diagnosis not present

## 2024-07-11 DIAGNOSIS — F17201 Nicotine dependence, unspecified, in remission: Secondary | ICD-10-CM | POA: Diagnosis not present

## 2024-07-11 DIAGNOSIS — E875 Hyperkalemia: Secondary | ICD-10-CM | POA: Diagnosis not present

## 2024-07-11 DIAGNOSIS — S83242S Other tear of medial meniscus, current injury, left knee, sequela: Secondary | ICD-10-CM | POA: Diagnosis not present

## 2024-07-11 DIAGNOSIS — M25551 Pain in right hip: Secondary | ICD-10-CM | POA: Diagnosis not present

## 2024-07-11 DIAGNOSIS — R413 Other amnesia: Secondary | ICD-10-CM | POA: Diagnosis not present

## 2024-07-11 DIAGNOSIS — H9193 Unspecified hearing loss, bilateral: Secondary | ICD-10-CM | POA: Diagnosis not present

## 2024-07-11 DIAGNOSIS — Z0000 Encounter for general adult medical examination without abnormal findings: Secondary | ICD-10-CM | POA: Diagnosis not present

## 2024-07-11 DIAGNOSIS — E782 Mixed hyperlipidemia: Secondary | ICD-10-CM | POA: Diagnosis not present

## 2024-07-11 DIAGNOSIS — M1990 Unspecified osteoarthritis, unspecified site: Secondary | ICD-10-CM | POA: Diagnosis not present

## 2024-07-26 ENCOUNTER — Telehealth: Payer: Medicare Other | Admitting: Diagnostic Neuroimaging

## 2024-07-26 ENCOUNTER — Encounter: Payer: Self-pay | Admitting: Diagnostic Neuroimaging

## 2024-07-26 DIAGNOSIS — G301 Alzheimer's disease with late onset: Secondary | ICD-10-CM | POA: Diagnosis not present

## 2024-07-26 DIAGNOSIS — F02B0 Dementia in other diseases classified elsewhere, moderate, without behavioral disturbance, psychotic disturbance, mood disturbance, and anxiety: Secondary | ICD-10-CM

## 2024-07-26 NOTE — Progress Notes (Signed)
 [facility]  PATIENT: Dominic Oliver DOB: [birth year]  REFERRING CLINICIAN: Shona Norleen PEDLAR, MD HISTORY FROM: patient REASON FOR VISIT: follow up   HISTORICAL  CHIEF COMPLAINT:  Chief Complaint  Patient presents with   Memory Loss    HISTORY OF PRESENT ILLNESS:   UPDATE (07/26/24, VRP): Since last visit, doing well. Symptoms are stable. Doing well at home. Testing results reviewed.  PRIOR HPI (01/20/24, VRP): 73 year old male here for evaluation of transient confusion.  12/19/2023 patient had fairly sudden onset transient confusion and memory loss, having difficulty remembering his daughter's name and the family plans for that week.  His blood pressure was noted to be somewhat elevated with systolic greater than 160 or 180.  When his daughter came to visit she noted that his skin was somewhat gray and pale in color.  Within an hour or so symptoms resolved.  Over the past few months or possibly 1 to 2 years there has been some gradual onset and progression of short-term memory loss, confusion, memory and cognitive decline.  He has also had vision and hearing loss over time.  His ADLs have been slightly affected due to combination of factors including retirement, vision, hearing, and cognitive issues.  He was previously very high functioning in his career, traveling around the world, training other people.  He retired about age 8 years old.   REVIEW OF SYSTEMS: Full 14 system review of systems performed and negative with exception of: as per HPI.  ALLERGIES: No Known Allergies  HOME MEDICATIONS: Outpatient Medications Prior to Visit  Medication Sig Dispense Refill   albuterol  (VENTOLIN  HFA) 108 (90 Base) MCG/ACT inhaler Inhale 1-2 puffs into the lungs every 6 (six) hours as needed for wheezing or shortness of breath. 18 g 0   ibuprofen  (ADVIL ,MOTRIN ) 200 MG tablet Take 200 mg by mouth as needed for headache or mild pain (pain score 1-3).     meloxicam  (MOBIC ) 7.5  MG tablet Take 1 tablet (7.5 mg total) by mouth daily. 7 tablet 0   memantine  (NAMENDA  TITRATION PACK) tablet pack 5 mg/day for =1 week; 5 mg twice daily for =1 week; 15 mg/day given in 5 mg and 10 mg separated doses for =1 week; then 10 mg twice daily 49 tablet 0   No facility-administered medications prior to visit.    PAST MEDICAL HISTORY: Past Medical History:  Diagnosis Date   Arthritis    knees, spine, thumbs   Diverticulitis    GERD (gastroesophageal reflux disease)    HLD (hyperlipidemia) 03/26/2017   Peptic ulcer disease    Prostate cancer (HCC)    prostate   Restless leg syndrome    amitripytline helps    PAST SURGICAL HISTORY: Past Surgical History:  Procedure Laterality Date   HERNIA REPAIR     INGUINAL HERNIA REPAIR Right 09/29/2014   Procedure: OPEN RIGHT IINGUINAL HERNIA REPAIR WITH MESH;  Surgeon: Camellia CHRISTELLA Blush, MD;  Location: [facility];  Service: General;  Laterality: Right;   INSERTION OF MESH Right 09/29/2014   Procedure: INSERTION OF MESH;  Surgeon: Camellia CHRISTELLA Blush, MD;  Location: [facility];  Service: General;  Laterality: Right;   KNEE ARTHROSCOPY  10/2003   LYMPHADENECTOMY Bilateral 03/18/2013   Procedure: LYMPHADENECTOMY;  Surgeon: Ricardo Likens, MD;  Location: [facility];  Service: Urology;  Laterality: Bilateral;   NASAL POLYP EXCISION  1989   ROBOT ASSISTED LAPAROSCOPIC RADICAL PROSTATECTOMY N/A 03/18/2013   Procedure: ROBOTIC ASSISTED LAPAROSCOPIC RADICAL PROSTATECTOMY;  Surgeon: Ricardo Likens,  MD;  Location: [facility];  Service: Urology;  Laterality: N/A;    FAMILY HISTORY: Family History  Problem Relation Age of Onset   Cancer Mother        lung   Colon polyps Father    Arthritis Father    Cancer Father        skin, prostate   Hearing loss Father    Hypertension Father    Stroke Father    Dementia Sister    Heart disease Paternal Uncle    Heart disease Paternal Uncle    Cancer Paternal Uncle     SOCIAL HISTORY: Social History   Socioeconomic  History   Marital status: Married    Spouse name: Materials engineer   Number of children: 2   Years of education: 13   Highest education level: Not on file  Occupational History   Occupation: retired  Tobacco Use   Smoking status: Former    Current packs/day: 0.00    Average packs/day: 0.3 packs/day for 30.0 years (7.5 ttl pk-yrs)    Types: Cigarettes    Start date: 09/28/1969    Quit date: 09/29/1999    Years since quitting: 24.8   Smokeless tobacco: Never   Tobacco comments:    quit smoking oct 2000  Substance and Sexual Activity   Alcohol use: Yes    Comment: rare beer   Drug use: No   Sexual activity: Yes  Other Topics Concern   Not on file  Social History Narrative   Lives at home with wife Rebeccah   Grandson stays frequently      Likes to walk, many projects   Retired    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  There is no height or weight on file to calculate BMI. Wt Readings from Last 3 Encounters:  01/20/24 131 lb (59.4 kg)  03/02/23 135 lb (61.2 kg)  10/07/21 137 lb 1.9 oz (62.2 kg)   Patient is in no distress; well developed, nourished and groomed; neck is supple  CARDIOVASCULAR: Examination of carotid arteries is normal; no carotid bruits Regular rate and rhythm, no murmurs Examination of peripheral vascular system by observation and palpation is  normal  EYES: Ophthalmoscopic exam of optic discs and posterior segments is normal; no papilledema or hemorrhages No results found.  MUSCULOSKELETAL: Gait, strength, tone, movements noted in Neurologic exam below  NEUROLOGIC: MENTAL STATUS:     01/20/2024   11:49 AM  MMSE - Mini Mental State Exam  Orientation to time 0  Orientation to Place 3  Registration 3  Attention/ Calculation 0  Recall 0  Language- name 2 objects 2  Language- repeat 1  Language- follow 3 step command 0  Language- read & follow direction 0  Write a sentence 1  Copy design 0  Total score 10   awake, alert, oriented to person [facility] MEMORY DECR attention and concentration language fluent, comprehension intact, naming intact fund of knowledge appropriate DECR INSIGHT  CRANIAL NERVE:  2nd - no papilledema on fundoscopic exam 2nd, 3rd, 4th, 6th - pupils equal and reactive to light, visual fields full to confrontation, extraocular muscles intact, no nystagmus 5th - facial sensation symmetric 7th - facial strength symmetric 8th - hearing intact 9th - palate elevates symmetrically, uvula midline 11th - shoulder shrug symmetric 12th - tongue protrusion midline  MOTOR:  normal bulk and tone, full strength in the BUE, BLE  SENSORY:  normal and symmetric to light touch, temperature, vibration  COORDINATION:  finger-nose-finger, fine finger movements normal  REFLEXES:  deep tendon reflexes TRACE and symmetric  GAIT/STATION:  narrow based gait     DIAGNOSTIC DATA (LABS, IMAGING, TESTING) - I reviewed patient records, labs, notes, testing and imaging myself where available.  Lab Results  Component Value Date   WBC 5.7 10/07/2021   HGB 14.9 10/07/2021   HCT 44.1 10/07/2021   MCV 94 10/07/2021   PLT 269 10/07/2021      Component Value Date/Time   NA 143 08/21/2021 2158   K 3.6 08/21/2021 2158   CL 109 08/21/2021 2158   CO2 26 08/21/2021 2158   GLUCOSE 122 (H) 08/21/2021 2158   BUN 18  08/21/2021 2158   CREATININE 0.88 08/21/2021 2158   CREATININE 1.08 02/26/2017 1016   CALCIUM 8.8 (L) 08/21/2021 2158   PROT 6.7 02/26/2017 1016   ALBUMIN 4.2 02/26/2017 1016   AST 23 02/26/2017 1016   ALT 16 02/26/2017 1016   ALKPHOS 34 (L) 02/26/2017 1016   BILITOT 1.4 (H) 02/26/2017 1016   GFRNONAA >60 08/21/2021 2158   GFRAA >90 03/08/2013 1425   Lab Results  Component Value Date   CHOL 239 (H) 02/26/2017   HDL 75 02/26/2017   LDLCALC 148 (H) 02/26/2017   TRIG 78 02/26/2017   CHOLHDL 3.2 02/26/2017   No results found for: HGBA1C No results found for: VITAMINB12 Lab Results  Component Value Date   TSH 2.010 10/07/2021   Component Ref Range & Units (hover) 6 mo ago  A -- Beta-amyloid 42/40 Ratio 0.098 Low   Beta-amyloid 42 22.11  Beta-amyloid 40 225.31  T -- p-tau181 2.26 High   N -- NfL, Plasma 5.35  ATN SUMMARY Comment  Comment:                        A+ T+ N- A low beta-amyloid 42/40 and a high pTau181 concentration were observed. A normal NfL concentration was observed at this time. These results are consistent with the presence of Alzheimer's related pathology.    12/31/03 CT head [I reviewed images myself and agree with interpretation. -VRP]  1. No acute intracranial abnormality. But suspicion of disproportionate mesial temporal lobe volume loss since 2019, raising the possibility of neurodegenerative disease. 2. Otherwise negative for age noncontrast Head CT.  02/23/24 MRI brain (with and without) demonstrating: - Moderate mesial temporal atrophy. - Mild periventricular and subcortical chronic small vessel ischemic disease. - Approximately 15 scattered supratentorial chronic cerebral microhemorrhages, can be seen in association with chronic small vessel ischemic disease or amyloid angiopathy / amyloid pathologies.  02/24/24 Abnormal EEG demonstrating: -Mild diffuse slowing in the theta range (6-7 hertz) consistent with mild  encephalopathy.   ASSESSMENT AND PLAN  73 y.o. year old male here with:  Dx:  1. Moderate  late onset Alzheimer's dementia without behavioral disturbance, psychotic disturbance, mood disturbance, or anxiety (HCC)      PLAN:  moderate neurodegenerative dementia due to alzheimer's disease - offered memantine ; family decided to hold off - not candidate for amyloid targeting therapies due to increased number of chronic cerebral microhemorrhages and advanced MMSE results (10/30).  - try to stay active physically and get some exercise (at least 15-30 minutes per day) - eat a nutritious diet with lean protein, plants / vegetables, whole grains; avoid ultra-processed foods - increase social activities, brain stimulation, games, puzzles, hobbies, crafts, arts, music; try new activities; keep it fun! - aim for at least 7-8 hours sleep per night (or more) - avoid smoking and alcohol - caution with medications, finances; no driving - safety / supervision issues reviewed - caregiver resources provided (including [email or web address])  TRANSIENT GLOBAL AMNESIA (12/19/23) - ddx: classic transient global amnesia versus TIA, seizure or related to underlying neurodegenerative disorder (amyloid spells) - continue supportive care as above  Return for pending if symptoms worsen or fail to improve, return to PCP.   Virtual Visit via Video Note  I connected with Dominic Oliver on 07/26/2024 at  1:30 PM EDT by a video enabled telemedicine application and verified that I am speaking with the correct person using two identifiers.   I discussed the limitations of evaluation and management by telemedicine and the availability of in person appointments. The patient expressed understanding and agreed to proceed.  Patient is at home and I am at the office.   I spent 25 minutes of face-to-face and non-face-to-face time with patient.  This included previsit chart review, lab review, study review, order entry, electronic  health record documentation, patient education.       EDUARD FABIENE HANLON, MD 07/26/2024, 1:44 PM Certified in Neurology, Neurophysiology and Neuroimaging  [facility] [address] [place], [place] [address] [phone]

## 2024-09-14 DIAGNOSIS — J0110 Acute frontal sinusitis, unspecified: Secondary | ICD-10-CM | POA: Diagnosis not present

## 2024-09-14 DIAGNOSIS — Z87891 Personal history of nicotine dependence: Secondary | ICD-10-CM | POA: Diagnosis not present

## 2025-01-11 ENCOUNTER — Other Ambulatory Visit (HOSPITAL_COMMUNITY): Payer: Self-pay | Admitting: Internal Medicine

## 2025-01-11 DIAGNOSIS — H539 Unspecified visual disturbance: Secondary | ICD-10-CM

## 2025-01-12 ENCOUNTER — Ambulatory Visit (HOSPITAL_COMMUNITY)
Admission: RE | Admit: 2025-01-12 | Discharge: 2025-01-12 | Disposition: A | Discharge status: Home / Self Care | Source: Ambulatory Visit | Attending: Internal Medicine | Admitting: Internal Medicine

## 2025-01-12 ENCOUNTER — Ambulatory Visit (HOSPITAL_COMMUNITY)
Admission: RE | Admit: 2025-01-12 | Discharge: 2025-01-12 | Disposition: A | Source: Ambulatory Visit | Attending: Internal Medicine | Admitting: Internal Medicine

## 2025-01-12 DIAGNOSIS — I6782 Cerebral ischemia: Secondary | ICD-10-CM | POA: Insufficient documentation

## 2025-01-12 DIAGNOSIS — R9089 Other abnormal findings on diagnostic imaging of central nervous system: Secondary | ICD-10-CM | POA: Insufficient documentation

## 2025-01-12 DIAGNOSIS — H539 Unspecified visual disturbance: Secondary | ICD-10-CM | POA: Diagnosis present

## 2025-05-26 ENCOUNTER — Institutional Professional Consult (permissible substitution): Admitting: Diagnostic Neuroimaging
